# Patient Record
Sex: Female | Born: 1955 | Race: White | Hispanic: No | Smoking: Current every day smoker
Health system: Southern US, Community
[De-identification: ages and names within clinical notes are randomized; demographics above are authoritative.]

## PROBLEM LIST (undated history)

## (undated) DIAGNOSIS — I5033 Acute on chronic diastolic (congestive) heart failure: Secondary | ICD-10-CM

## (undated) DIAGNOSIS — R0789 Other chest pain: Secondary | ICD-10-CM

## (undated) DIAGNOSIS — Z87442 Personal history of urinary calculi: Secondary | ICD-10-CM

## (undated) HISTORY — PX: ABDOMINAL HYSTERECTOMY: SHX81

## (undated) HISTORY — PX: SHOULDER SURGERY: SHX246

## (undated) HISTORY — PX: OTHER SURGICAL HISTORY: SHX169

---

## 2002-02-17 ENCOUNTER — Other Ambulatory Visit: Admission: RE | Admit: 2002-02-17 | Discharge: 2002-02-17 | Payer: Self-pay | Admitting: *Deleted

## 2003-07-20 ENCOUNTER — Other Ambulatory Visit: Admission: RE | Admit: 2003-07-20 | Discharge: 2003-07-20 | Payer: Self-pay | Admitting: *Deleted

## 2003-11-16 ENCOUNTER — Ambulatory Visit (HOSPITAL_BASED_OUTPATIENT_CLINIC_OR_DEPARTMENT_OTHER): Admission: RE | Admit: 2003-11-16 | Discharge: 2003-11-16 | Payer: Self-pay | Admitting: Orthopedic Surgery

## 2004-12-24 ENCOUNTER — Other Ambulatory Visit: Admission: RE | Admit: 2004-12-24 | Discharge: 2004-12-24 | Payer: Self-pay | Admitting: *Deleted

## 2006-02-04 ENCOUNTER — Other Ambulatory Visit: Admission: RE | Admit: 2006-02-04 | Discharge: 2006-02-04 | Payer: Self-pay | Admitting: *Deleted

## 2011-09-17 ENCOUNTER — Emergency Department (HOSPITAL_COMMUNITY): Payer: BC Managed Care – PPO

## 2011-09-17 ENCOUNTER — Encounter: Payer: Self-pay | Admitting: *Deleted

## 2011-09-17 ENCOUNTER — Emergency Department (HOSPITAL_COMMUNITY)
Admission: EM | Admit: 2011-09-17 | Discharge: 2011-09-17 | Disposition: A | Discharge status: Home / Self Care | Payer: BC Managed Care – PPO | Attending: Emergency Medicine | Admitting: Emergency Medicine

## 2011-09-17 DIAGNOSIS — S93602A Unspecified sprain of left foot, initial encounter: Secondary | ICD-10-CM

## 2011-09-17 DIAGNOSIS — M25579 Pain in unspecified ankle and joints of unspecified foot: Secondary | ICD-10-CM | POA: Insufficient documentation

## 2011-09-17 DIAGNOSIS — M79609 Pain in unspecified limb: Secondary | ICD-10-CM | POA: Insufficient documentation

## 2011-09-17 DIAGNOSIS — S93609A Unspecified sprain of unspecified foot, initial encounter: Secondary | ICD-10-CM | POA: Insufficient documentation

## 2011-09-17 DIAGNOSIS — S93402A Sprain of unspecified ligament of left ankle, initial encounter: Secondary | ICD-10-CM

## 2011-09-17 DIAGNOSIS — X500XXA Overexertion from strenuous movement or load, initial encounter: Secondary | ICD-10-CM | POA: Insufficient documentation

## 2011-09-17 DIAGNOSIS — S93409A Sprain of unspecified ligament of unspecified ankle, initial encounter: Secondary | ICD-10-CM | POA: Insufficient documentation

## 2011-09-17 MED ORDER — OXYCODONE-ACETAMINOPHEN 5-325 MG PO TABS
1.0000 | ORAL_TABLET | Freq: Once | ORAL | Status: AC
  Administered 2011-09-17: 1 via ORAL
  Filled 2011-09-17: qty 1

## 2011-09-17 MED ORDER — OXYCODONE-ACETAMINOPHEN 5-325 MG PO TABS: 1.0000 | ORAL_TABLET | ORAL | Status: AC | PRN

## 2011-09-17 NOTE — ED Provider Notes (Signed)
History     CSN: [id number]  Arrival date & time 09/17/11  0106   First MD Initiated Contact with Patient 09/17/11 0116      Chief Complaint  Patient presents with  . Ankle Pain    (Consider location/radiation/quality/duration/timing/severity/associated sxs/prior treatment) Patient is a 55 y.o. female presenting with ankle pain. The history is provided by the patient.  Ankle Pain    55 year old female twisted her left foot and ankle while going down steps. She did not actually fall. She is complaining of pain across the lateral aspect of the left ankle and proximal left midfoot and into the dorsum of the left midfoot. Pain is severe and she rates it at 8/10. Pain is worse with trying to bear weight and with movement and with palpation. Nothing makes it any better. She denies other injury.  No past medical history on file.  No past surgical history on file.  No family history on file.  History  Substance Use Topics  . Smoking status: Not on file  . Smokeless tobacco: Not on file  . Alcohol Use: Not on file    OB History    No data available      Review of Systems  All other systems reviewed and are negative.    Allergies  Review of patient's allergies indicates not on file.  Home Medications  No current outpatient prescriptions on file.  There were no vitals taken for this visit.  Physical Exam  Nursing note and vitals reviewed.  55 year old female is resting comfortably and in no acute distress. Vital signs are normal. Head is normocephalic and atraumatic. PERRLA, EOMI. Oropharynx is clear. Neck is supple without adenopathy. Back is nontender. Lungs are clear with.rales, wheezes, rhonchi. Heart has regular rate and rhythm without murmur. Abdomen is soft, flat, nontender without masses or visceromegaly. Extremities: There is mild to moderate swelling of the dorsolateral aspect of the proximal left midfoot. There is tenderness to palpation of the left ankle in the  region of the left fibulotalar ligament. There is moderate tenderness to palpation over the lateral aspect of the dorsum of the proximal midfoot. There is no tenderness palpation over the fifth metatarsal. Distal neurovascular exam is intact with prompt capillary refill and normal sensation. Skin is warm and moist without rash. Neurologic: The mental status is normal, cranial nerves are intact, there no focal motor or sensory deficits. Psychiatric: No abnormalities of mood or affect.  ED Course  Procedures (including critical care time)  Labs Reviewed - No data to display No results found. No results found for this or any previous visit. Dg Ankle Complete Left  09/17/2011  *RADIOLOGY REPORT*  Clinical Data: Twisted left ankle and fell down steps; lateral ankle pain.  LEFT ANKLE COMPLETE - 3+ VIEW  Comparison: None.  Findings: There is no evidence of fracture or dislocation.  A small osseous fragment adjacent to the medial malleolus may reflect remote injury.  The ankle mortise is intact; the interosseous space is within normal limits.  No talar tilt or subluxation is seen.  The joint spaces are preserved.  No significant soft tissue abnormalities are seen.  IMPRESSION: No evidence of acute fracture or dislocation.  Original Report Authenticated By: Tonia Ghent, M.D.   Dg Foot Complete Left  09/17/2011  *RADIOLOGY REPORT*  Clinical Data: Twisted ankle and fell down steps; dorsal left foot pain.  LEFT FOOT - COMPLETE 3+ VIEW  Comparison: None.  Findings: There is no evidence of fracture or dislocation.  Partial absence of the fifth middle phalanx likely reflects remote injury. The joint spaces are preserved.  There is no evidence of talar subluxation; the subtalar joint is unremarkable in appearance.  No significant soft tissue abnormalities are seen.  IMPRESSION: No evidence of fracture or dislocation.  Original Report Authenticated By: Tonia Ghent, M.D.     No diagnosis found.  X-rays show no  evidence of fracture. She is placed in an ankle splint orthotic and given crutches to use for comfort. She has been given a dose of Percocet in the emergency department and will be given a prescription for Percocet to take at home. She is referred to Dr. Lajoyce Corners, who is on call for orthopedics.  MDM  Foot and ankle injury - possible fracture        Dione Booze, MD 09/17/11 0157

## 2011-09-17 NOTE — ED Notes (Signed)
Patient states she was walking down the steps and thought she was on the last step and twisted her left ankle

## 2011-09-17 NOTE — ED Notes (Signed)
Crutches and ankle splint  Applied  Instructions given to patient

## 2013-04-18 ENCOUNTER — Emergency Department (HOSPITAL_COMMUNITY): Payer: BC Managed Care – PPO | Admitting: Anesthesiology

## 2013-04-18 ENCOUNTER — Emergency Department (HOSPITAL_COMMUNITY)
Admission: EM | Admit: 2013-04-18 | Discharge: 2013-04-19 | Disposition: A | Discharge status: Home / Self Care | Payer: BC Managed Care – PPO | Attending: Emergency Medicine | Admitting: Emergency Medicine

## 2013-04-18 ENCOUNTER — Emergency Department (HOSPITAL_COMMUNITY): Payer: BC Managed Care – PPO

## 2013-04-18 ENCOUNTER — Encounter (HOSPITAL_COMMUNITY): Payer: Self-pay | Admitting: Nurse Practitioner

## 2013-04-18 ENCOUNTER — Encounter (HOSPITAL_COMMUNITY)
Admission: EM | Disposition: A | Discharge status: Home / Self Care | Payer: Self-pay | Source: Home / Self Care | Attending: Emergency Medicine

## 2013-04-18 ENCOUNTER — Encounter (HOSPITAL_COMMUNITY): Payer: Self-pay | Admitting: Anesthesiology

## 2013-04-18 DIAGNOSIS — N1330 Unspecified hydronephrosis: Secondary | ICD-10-CM | POA: Insufficient documentation

## 2013-04-18 DIAGNOSIS — R109 Unspecified abdominal pain: Secondary | ICD-10-CM | POA: Insufficient documentation

## 2013-04-18 DIAGNOSIS — R310 Gross hematuria: Secondary | ICD-10-CM | POA: Insufficient documentation

## 2013-04-18 DIAGNOSIS — N201 Calculus of ureter: Secondary | ICD-10-CM

## 2013-04-18 HISTORY — PX: CYSTOSCOPY W/ RETROGRADES: SHX1426

## 2013-04-18 LAB — URINALYSIS, ROUTINE W REFLEX MICROSCOPIC
Glucose, UA: NEGATIVE mg/dL
Ketones, ur: 15 mg/dL — AB
Nitrite: POSITIVE — AB
Protein, ur: 100 mg/dL — AB
Specific Gravity, Urine: 1.023 (ref 1.005–1.030)
Urobilinogen, UA: 1 mg/dL (ref 0.0–1.0)
pH: 5.5 (ref 5.0–8.0)

## 2013-04-18 LAB — CBC WITH DIFFERENTIAL/PLATELET
Basophils Absolute: 0.1 K/uL (ref 0.0–0.1)
Basophils Relative: 0 % (ref 0–1)
Eosinophils Absolute: 0 K/uL (ref 0.0–0.7)
Eosinophils Relative: 0 % (ref 0–5)
HCT: 42.8 % (ref 36.0–46.0)
Hemoglobin: 14.9 g/dL (ref 12.0–15.0)
Lymphocytes Relative: 6 % — ABNORMAL LOW (ref 12–46)
Lymphs Abs: 1.1 10*3/uL (ref 0.7–4.0)
MCH: 31 pg (ref 26.0–34.0)
MCHC: 34.8 g/dL (ref 30.0–36.0)
MCV: 89.2 fL (ref 78.0–100.0)
Monocytes Absolute: 0.3 10*3/uL (ref 0.1–1.0)
Monocytes Relative: 1 % — ABNORMAL LOW (ref 3–12)
Neutro Abs: 17.9 K/uL — ABNORMAL HIGH (ref 1.7–7.7)
Neutrophils Relative %: 93 % — ABNORMAL HIGH (ref 43–77)
Platelets: 289 K/uL (ref 150–400)
RBC: 4.8 MIL/uL (ref 3.87–5.11)
RDW: 13.2 % (ref 11.5–15.5)
WBC: 19.3 10*3/uL — ABNORMAL HIGH (ref 4.0–10.5)

## 2013-04-18 LAB — COMPREHENSIVE METABOLIC PANEL WITH GFR
ALT: 17 U/L (ref 0–35)
AST: 27 U/L (ref 0–37)
CO2: 23 meq/L (ref 19–32)
Calcium: 9.8 mg/dL (ref 8.4–10.5)
Chloride: 100 meq/L (ref 96–112)
Creatinine, Ser: 0.95 mg/dL (ref 0.50–1.10)
GFR calc Af Amer: 75 mL/min — ABNORMAL LOW (ref >90)
GFR calc non Af Amer: 64 mL/min — ABNORMAL LOW (ref >90)
Glucose, Bld: 110 mg/dL — ABNORMAL HIGH (ref 70–99)
Sodium: 136 meq/L (ref 135–145)
Total Bilirubin: 0.4 mg/dL (ref 0.3–1.2)

## 2013-04-18 LAB — URINE MICROSCOPIC-ADD ON

## 2013-04-18 LAB — COMPREHENSIVE METABOLIC PANEL
Albumin: 4.6 g/dL (ref 3.5–5.2)
Alkaline Phosphatase: 80 U/L (ref 39–117)
BUN: 26 mg/dL — ABNORMAL HIGH (ref 6–23)
Potassium: 4.7 mEq/L (ref 3.5–5.1)
Total Protein: 7.9 g/dL (ref 6.0–8.3)

## 2013-04-18 SURGERY — CYSTOSCOPY, WITH RETROGRADE PYELOGRAM
Anesthesia: General | Site: Perineum | Laterality: Left | Wound class: Clean Contaminated

## 2013-04-18 MED ORDER — PHENYLEPHRINE HCL 10 MG/ML IJ SOLN
INTRAMUSCULAR | Status: DC | PRN
  Administered 2013-04-18: 80 ug via INTRAVENOUS
  Administered 2013-04-19: 120 ug via INTRAVENOUS
  Administered 2013-04-19: 80 ug via INTRAVENOUS

## 2013-04-18 MED ORDER — LIDOCAINE HCL (CARDIAC) 20 MG/ML IV SOLN
INTRAVENOUS | Status: DC | PRN
  Administered 2013-04-18: 50 mg via INTRAVENOUS

## 2013-04-18 MED ORDER — DEXTROSE 5 % IV SOLN
1.0000 g | Freq: Once | INTRAVENOUS | Status: AC
  Administered 2013-04-18: 1 g via INTRAVENOUS
  Filled 2013-04-18: qty 10

## 2013-04-18 MED ORDER — LACTATED RINGERS IV SOLN
INTRAVENOUS | Status: DC | PRN
  Administered 2013-04-18: 23:00:00 via INTRAVENOUS

## 2013-04-18 MED ORDER — MORPHINE SULFATE 4 MG/ML IJ SOLN
4.0000 mg | Freq: Once | INTRAMUSCULAR | Status: AC
  Administered 2013-04-18: 4 mg via INTRAVENOUS
  Filled 2013-04-18: qty 1

## 2013-04-18 MED ORDER — SODIUM CHLORIDE 0.9 % IV BOLUS (SEPSIS)
1000.0000 mL | Freq: Once | INTRAVENOUS | Status: AC
  Administered 2013-04-18: 1000 mL via INTRAVENOUS

## 2013-04-18 MED ORDER — CEFAZOLIN SODIUM 1-5 GM-% IV SOLN
INTRAVENOUS | Status: DC | PRN
  Administered 2013-04-18: 1 g via INTRAVENOUS

## 2013-04-18 MED ORDER — PROPOFOL 10 MG/ML IV BOLUS
INTRAVENOUS | Status: DC | PRN
  Administered 2013-04-18: 90 mg via INTRAVENOUS

## 2013-04-18 SURGICAL SUPPLY — 15 items
ADAPTER CATH URET PLST 4-6FR (CATHETERS) ×1 IMPLANT
ADPR CATH URET STRL DISP 4-6FR (CATHETERS) ×1
BAG URO CATCHER STRL LF (DRAPE) ×1 IMPLANT
CANISTER SUCT LVC 12 LTR MEDI- (MISCELLANEOUS) ×1 IMPLANT
CATH URET 5FR 28IN OPEN ENDED (CATHETERS) ×1 IMPLANT
DRAPE CAMERA CLOSED 9X96 (DRAPES) ×1 IMPLANT
GLOVE BIO SURGEON STRL SZ7.5 (GLOVE) ×1 IMPLANT
GLOVE BIO SURGEON STRL SZ8 (GLOVE) ×1 IMPLANT
GLOVE BIOGEL PI IND STRL 8 (GLOVE) IMPLANT
GLOVE BIOGEL PI INDICATOR 8 (GLOVE) ×1
GUIDEWIRE STR DUAL SENSOR (WIRE) ×1 IMPLANT
KIT ROOM TURNOVER OR (KITS) ×1 IMPLANT
PACK CYSTOSCOPY (CUSTOM PROCEDURE TRAY) ×1 IMPLANT
SOLIDIFIER ZAPPA LOC 440G (MISCELLANEOUS) ×4 IMPLANT
TOWEL OR 17X24 6PK STRL BLUE (TOWEL DISPOSABLE) ×1 IMPLANT

## 2013-04-18 NOTE — ED Notes (Signed)
Pt reports hematuria, lower abd pain, and nausea onset today.

## 2013-04-18 NOTE — H&P (Signed)
Urology History and Physical Exam  CC: Left ureter stone.  HPI: 57 year old female presents to the ER with left flank pain. CT abdomen and pelvis without contrast revealed a ureter stone. It is located in the left ureter. It is proximal in nature. It is 7 mm in size. There is also a smaller left lower pole stone present. There shredding around the kidney. There is also hydronephrosis. This is associated with gross hematuria. UA is positive for leukocyte esterase and nitrites and concerning for infection. She had leukocytosis to 19. She denies fever, chills, nausea, or emesis. She did not have tachycardia and does not appear septic. She never had a stone previously. This all began around 4 PM yesterday. We discussed management options including trial of stone passage versus placement of ureter stent. Given her clinical picture with concern over infection I recommend placing a stent.  Today we discussed the management of obstructing urinary stones. These options include observation with pain control, percutaneous nephrostomy tube, or cystoscopy with retrograde ureteral stent placement with possible ureteroscopy and laser lithotripsy.  We discussed the risks, benefits, alternatives and likelihood of achieving the patient's goals.  We discussed which options are relevant to this particular situation. We discussed the natural history of stones and the as well as the complications of untreated stones and the impact on quality of life without treatment as well as with each of the above listed treatments. We also discussed the efficacy of each treatment. With any of these management options I discussed the signs and symptoms of infection and the need for emergent treatment should these be experienced.   For observation I described the risks which include, but are not limited to, silent renal damage, life-threatening infection, need for emergent surgery, failure to pass stone, and pain.  For stent placement with  or without ureteroscopy and lithotripsy I described the risks which include, but are not limited to, heart attack, stroke, pulmonary embolus, death, bleeding, infection, damage to contiguous structures, positioning injury, ureteral stricture, ureteral avulsion, ureteral injury, need for ureteral stent, inability to perform ureteroscopy, need for an interval procedure, inability to clear stone burden, stent discomfort and pain.  For percutaneous nephrostomy tube I described the risks which including, but are not limited to, heart attack, stroke, pulmonary embolus, death, arterial venous fistula or malformation, need for embolization of kidney, loss of kidney or renal function.  The patient had the opportunity to ask questions to their stated satisfaction and voiced understanding.     PMH: History reviewed. No pertinent past medical history.  PSH: Past Surgical History  Procedure Laterality Date  . Abdominal hysterectomy    . Shoulder surgery    . Right ankle surgery      Allergies: Allergies  Allergen Reactions  . Penicillins Rash    Medications:  (Not in a hospital admission)   Social History: History   Social History  . Marital Status: Divorced    Spouse Name: N/A    Number of Children: N/A  . Years of Education: N/A   Occupational History  . Not on file.   Social History Main Topics  . Smoking status: Current Every Day Smoker -- 1.00 packs/day    Types: Cigarettes  . Smokeless tobacco: Not on file  . Alcohol Use: No  . Drug Use: No  . Sexually Active: Not Currently   Other Topics Concern  . Not on file   Social History Narrative  . No narrative on file    Family History:  History reviewed. No pertinent family history.  Review of Systems: Positive: Left flank pain, gross hematuria, cough. Negative: Chest pain, SOB, or fever.  A further 10 point review of systems was negative except what is listed in the HPI.  Physical Exam: Filed Vitals:   04/18/13  1819  BP: 153/95  Pulse: 91  Temp: 98.4 F (36.9 C)  Resp: 16    General: No acute distress.  Awake. Head:  Normocephalic.  Atraumatic. ENT:  EOMI.  Mucous membranes moist Neck:  Supple.  No lymphadenopathy. CV:  S1 present. S2 present. Regular rate. Pulmonary: Equal effort bilaterally.  Clear to auscultation bilaterally. Abdomen: Soft.  Non- tender to palpation. Skin:  Normal turgor.  No visible rash. Extremity: No gross deformity of bilateral upper extremities.  No gross deformity of    bilateral lower extremities. Neurologic: Alert. Appropriate mood.    Studies:  Recent Labs     04/18/13  1935  HGB  14.9  WBC  19.3*  PLT  289    Recent Labs     04/18/13  1935  NA  136  K  4.7  CL  100  CO2  23  BUN  26*  CREATININE  0.95  CALCIUM  9.8  GFRNONAA  64*  GFRAA  75*     No results found for this basename: PT, INR, APTT,  in the last 72 hours   No components found with this basename: ABG,     Assessment:  Left ureter stone  Plan: To OR for cystoscopy, possible left retrograde pyelogram, and left ureter stent placement.

## 2013-04-18 NOTE — Anesthesia Preprocedure Evaluation (Addendum)
Anesthesia Evaluation  Patient identified by MRN, date of birth, ID band Patient awake and Patient unresponsive    Reviewed: Allergy & Precautions, H&P , NPO status , Patient's Chart, lab work & pertinent test results, reviewed documented beta blocker date and time   Airway Mallampati: I TM Distance: >3 FB Neck ROM: full    Dental  (+) Teeth Intact and Dental Advisory Given   Pulmonary Current Smoker,          Cardiovascular Rhythm:regular Rate:Normal     Neuro/Psych    GI/Hepatic   Endo/Other    Renal/GU Renal disease     Musculoskeletal   Abdominal   Peds  Hematology   Anesthesia Other Findings Pt sedated from pain meds  Reproductive/Obstetrics                        Anesthesia Physical Anesthesia Plan  ASA: II and emergent  Anesthesia Plan: General   Post-op Pain Management:    Induction: Intravenous  Airway Management Planned: Oral ETT and LMA  Additional Equipment:   Intra-op Plan:   Post-operative Plan: Extubation in OR  Informed Consent: I have reviewed the patients History and Physical, chart, labs and discussed the procedure including the risks, benefits and alternatives for the proposed anesthesia with the patient or authorized representative who has indicated his/her understanding and acceptance.     Plan Discussed with: CRNA, Anesthesiologist and Surgeon  Anesthesia Plan Comments:        Anesthesia Quick Evaluation

## 2013-04-18 NOTE — ED Provider Notes (Signed)
CSN: [id number]     Arrival date & time 04/18/13  1804 History     First MD Initiated Contact with Patient 04/18/13 1928     Chief Complaint  Patient presents with  . Abdominal Pain   (Consider location/radiation/quality/duration/timing/severity/associated sxs/prior Treatment) Patient is a 57 y.o. female presenting with flank pain. The history is provided by the patient.  Flank Pain This is a new (left sided, radaites to left groin) problem. The current episode started today (16:00). The problem occurs constantly. The problem has been gradually worsening. Associated symptoms include urinary symptoms (hematuria; started this afternoon, worsening to frank hematuria.). Pertinent negatives include no abdominal pain, arthralgias, change in bowel habit, chest pain, chills, coughing, diaphoresis, fever, headaches, joint swelling, myalgias, nausea, neck pain, numbness, sore throat, swollen glands, vertigo, vomiting or weakness. The symptoms are aggravated by standing. She has tried nothing for the symptoms.    History reviewed. No pertinent past medical history. Past Surgical History  Procedure Laterality Date  . Abdominal hysterectomy    . Shoulder surgery    . Right ankle surgery     History reviewed. No pertinent family history. History  Substance Use Topics  . Smoking status: Current Every Day Smoker -- 1.00 packs/day    Types: Cigarettes  . Smokeless tobacco: Not on file  . Alcohol Use: No   OB History   Grav Para Term Preterm Abortions TAB SAB Ect Mult Living                 Review of Systems  Constitutional: Negative for fever, chills, diaphoresis, activity change and appetite change.  HENT: Negative for sore throat, rhinorrhea, sneezing, drooling, trouble swallowing and neck pain.   Eyes: Negative for discharge and redness.  Respiratory: Negative for cough, chest tightness, shortness of breath, wheezing and stridor.   Cardiovascular: Negative for chest pain and leg swelling.   Gastrointestinal: Negative for nausea, vomiting, abdominal pain, diarrhea, constipation, blood in stool and change in bowel habit.  Genitourinary: Positive for hematuria and flank pain. Negative for urgency, decreased urine volume, vaginal bleeding, vaginal discharge, difficulty urinating, vaginal pain and pelvic pain.  Musculoskeletal: Negative for myalgias, joint swelling and arthralgias.  Skin: Negative for pallor.  Neurological: Negative for dizziness, vertigo, syncope, speech difficulty, weakness, light-headedness, numbness and headaches.  Hematological: Negative for adenopathy. Does not bruise/bleed easily.  Psychiatric/Behavioral: Negative for confusion and agitation.    Allergies  Penicillins  Home Medications   Current Outpatient Rx  Name  Route  Sig  Dispense  Refill  . ciprofloxacin (CIPRO) 500 MG tablet   Oral   Take 1 tablet (500 mg total) by mouth 2 (two) times daily.   14 tablet   0   . hyoscyamine (LEVSIN, ANASPAZ) 0.125 MG tablet   Oral   Take 1 tablet (0.125 mg total) by mouth every 4 (four) hours as needed for cramping (bladder spasms).   40 tablet   4   . oxybutynin (DITROPAN) 5 MG tablet   Oral   Take 1 tablet (5 mg total) by mouth every 6 (six) hours as needed (bladder spasms).   40 tablet   4   . oxyCODONE-acetaminophen (PERCOCET) 5-325 MG per tablet   Oral   Take 1-2 tablets by mouth every 4 (four) hours as needed for pain.   60 tablet   0   . phenazopyridine (PYRIDIUM) 100 MG tablet   Oral   Take 1 tablet (100 mg total) by mouth every 8 (eight) hours as needed  for pain (Burning urination.  Will turn urine and body fluids orange.).   30 tablet   1   . senna-docusate (SENOKOT S) 8.6-50 MG per tablet   Oral   Take 1 tablet by mouth 2 (two) times daily.   60 tablet   0    BP 153/95  Pulse 91  Temp(Src) 99.3 F (37.4 C) (Oral)  Resp 16  Ht 5\' 2"  (1.575 m)  Wt 161 lb (73.029 kg)  BMI 29.44 kg/m2  SpO2 100% Physical Exam   Constitutional: She is oriented to person, place, and time. She appears well-developed and well-nourished. No distress.  HENT:  Head: Normocephalic and atraumatic.  Right Ear: External ear normal.  Left Ear: External ear normal.  Eyes: Conjunctivae and EOM are normal. Right eye exhibits no discharge. Left eye exhibits no discharge.  Neck: Normal range of motion. Neck supple. No JVD present.  Cardiovascular: Normal rate, regular rhythm and normal heart sounds.  Exam reveals no gallop and no friction rub.   No murmur heard. Pulmonary/Chest: Effort normal and breath sounds normal. No stridor. No respiratory distress. She has no wheezes. She has no rales. She exhibits no tenderness.  Abdominal: Soft. Bowel sounds are normal. She exhibits no distension. There is tenderness in the suprapubic area. There is CVA tenderness (left). There is no rigidity, no rebound, no guarding, no tenderness at McBurney's point and negative Murphy's sign.  Musculoskeletal: Normal range of motion. She exhibits no edema.  Neurological: She is alert and oriented to person, place, and time.  Skin: Skin is warm. No rash noted. She is not diaphoretic.  Psychiatric: She has a normal mood and affect. Her behavior is normal.    ED Course   Procedures (including critical care time)  Labs Reviewed  COMPREHENSIVE METABOLIC PANEL - Abnormal; Notable for the following:    Glucose, Bld 110 (*)    BUN 26 (*)    GFR calc non Af Amer 64 (*)    GFR calc Af Amer 75 (*)    All other components within normal limits  CBC WITH DIFFERENTIAL - Abnormal; Notable for the following:    WBC 19.3 (*)    Neutrophils Relative % 93 (*)    Neutro Abs 17.9 (*)    Lymphocytes Relative 6 (*)    Monocytes Relative 1 (*)    All other components within normal limits  URINALYSIS, ROUTINE W REFLEX MICROSCOPIC - Abnormal; Notable for the following:    Color, Urine RED (*)    APPearance TURBID (*)    Hgb urine dipstick LARGE (*)    Bilirubin  Urine SMALL (*)    Ketones, ur 15 (*)    Protein, ur 100 (*)    Nitrite POSITIVE (*)    Leukocytes, UA MODERATE (*)    All other components within normal limits  URINE MICROSCOPIC-ADD ON - Abnormal; Notable for the following:    Bacteria, UA MANY (*)    All other components within normal limits  URINE CULTURE  CBC WITH DIFFERENTIAL  COMPREHENSIVE METABOLIC PANEL   Results for orders placed during the hospital encounter of 04/18/13  COMPREHENSIVE METABOLIC PANEL      Result Value Range   Sodium 136  135 - 145 mEq/L   Potassium 4.7  3.5 - 5.1 mEq/L   Chloride 100  96 - 112 mEq/L   CO2 23  19 - 32 mEq/L   Glucose, Bld 110 (*) 70 - 99 mg/dL   BUN 26 (*) 6 -  23 mg/dL   Creatinine, Ser 8.29  0.50 - 1.10 mg/dL   Calcium 9.8  8.4 - 56.2 mg/dL   Total Protein 7.9  6.0 - 8.3 g/dL   Albumin 4.6  3.5 - 5.2 g/dL   AST 27  0 - 37 U/L   ALT 17  0 - 35 U/L   Alkaline Phosphatase 80  39 - 117 U/L   Total Bilirubin 0.4  0.3 - 1.2 mg/dL   GFR calc non Af Amer 64 (*) >90 mL/min   GFR calc Af Amer 75 (*) >90 mL/min  CBC WITH DIFFERENTIAL      Result Value Range   WBC 19.3 (*) 4.0 - 10.5 K/uL   RBC 4.80  3.87 - 5.11 MIL/uL   Hemoglobin 14.9  12.0 - 15.0 g/dL   HCT 13.0  86.5 - 78.4 %   MCV 89.2  78.0 - 100.0 fL   MCH 31.0  26.0 - 34.0 pg   MCHC 34.8  30.0 - 36.0 g/dL   RDW 69.6  29.5 - 28.4 %   Platelets 289  150 - 400 K/uL   Neutrophils Relative % 93 (*) 43 - 77 %   Neutro Abs 17.9 (*) 1.7 - 7.7 K/uL   Lymphocytes Relative 6 (*) 12 - 46 %   Lymphs Abs 1.1  0.7 - 4.0 K/uL   Monocytes Relative 1 (*) 3 - 12 %   Monocytes Absolute 0.3  0.1 - 1.0 K/uL   Eosinophils Relative 0  0 - 5 %   Eosinophils Absolute 0.0  0.0 - 0.7 K/uL   Basophils Relative 0  0 - 1 %   Basophils Absolute 0.1  0.0 - 0.1 K/uL  URINALYSIS, ROUTINE W REFLEX MICROSCOPIC      Result Value Range   Color, Urine RED (*) YELLOW   APPearance TURBID (*) CLEAR   Specific Gravity, Urine 1.023  1.005 - 1.030   pH 5.5  5.0 -  8.0   Glucose, UA NEGATIVE  NEGATIVE mg/dL   Hgb urine dipstick LARGE (*) NEGATIVE   Bilirubin Urine SMALL (*) NEGATIVE   Ketones, ur 15 (*) NEGATIVE mg/dL   Protein, ur 132 (*) NEGATIVE mg/dL   Urobilinogen, UA 1.0  0.0 - 1.0 mg/dL   Nitrite POSITIVE (*) NEGATIVE   Leukocytes, UA MODERATE (*) NEGATIVE  URINE MICROSCOPIC-ADD ON      Result Value Range   Squamous Epithelial / LPF RARE  RARE   WBC, UA 7-10  <3 WBC/hpf   RBC / HPF TOO NUMEROUS TO COUNT  <3 RBC/hpf   Bacteria, UA MANY (*) RARE    Ct Abdomen Pelvis Wo Contrast  04/18/2013   *RADIOLOGY REPORT*  Clinical Data: Hematuria and lower abdominal pain.  CT ABDOMEN AND PELVIS WITHOUT CONTRAST  Technique:  Multidetector CT imaging of the abdomen and pelvis was performed following the standard protocol without intravenous contrast.  Comparison: None.  Findings: There is evidence of moderately severe hydronephrosis of the left kidney with a 7 mm calculus located in the proximal ureter.  Small nonobstructing calculi are present in the lower pole of the left kidney.  No calculi are identified on the right side.  Unenhanced appearance of the liver, gallbladder, pancreas, spleen, adrenal glands and bowel are unremarkable.  The bladder is decompressed and normal in appearance.  There is a small right inguinal hernia containing fat.  No abnormal fluid collections are seen.  No evidence of focal mass or enlarged lymph nodes.  Bony  structures show degenerative changes of the lumbar spine.  IMPRESSION: Moderately severe left hydronephrosis secondary to a 7 mm calculus located in the proximal ureter.   Original Report Authenticated By: Irish Lack, M.D.   1. Ureteral stone     MDM  Enid Skeens 57 y.o. presents with flank pain and frank hematuria. AFVSS. Hemodynamically stable. CVA tenderness noted. UA concerning for UTI with large amount of blood. CT stone study significant for 7 mm stone in the left proximal ureter with severe hydronephrosis. Patient  was given 1 g Rocephin emergency department, an IV fluids. Urology consult.  Send urine for culture. Urology would rec a stent. Patient taken the operating room to have a ureteral stent placed. Care is assumed by the urologist.  Labs and imaging reviewed. I discussed patient's care at my attending, Dr. Oletta Lamas.  Sena Hitch, MD 04/19/13 [phone]

## 2013-04-18 NOTE — Preoperative (Signed)
Beta Blockers   Reason not to administer Beta Blockers:Not Applicable. No home beta blockers 

## 2013-04-18 NOTE — ED Provider Notes (Addendum)
Medical screening examination/treatment/procedure(s) were conducted as a shared visit with non-physician practitioner(s) and myself.  I personally evaluated the patient during the encounter  Pt with flank pain, gross hematuria.  CT shows proximal ureteral stone.  Pain controlled in the ED.  Will need strainer and referral to urologist for follow up.  Abd soft, no guard or rebound.    Gavin Pound. Natoria Archibald, MD 04/18/13 2117   After Dr. Malen Gauze discussed with urologist, given no prior h/o stone, possible infection and 7 mm size, recommended ureteral stent placement.  Pt opted to go ahead and proceed today.  Dr. Margarita Grizzle to see pt in the ED and admit for stent placement.    Gavin Pound. Oletta Lamas, MD 04/19/13 [phone]

## 2013-04-19 ENCOUNTER — Emergency Department (HOSPITAL_COMMUNITY): Payer: BC Managed Care – PPO

## 2013-04-19 MED ORDER — OXYCODONE-ACETAMINOPHEN 5-325 MG PO TABS: 1.0000 | ORAL_TABLET | ORAL | Status: DC | PRN

## 2013-04-19 MED ORDER — CIPROFLOXACIN HCL 500 MG PO TABS: 500.0000 mg | ORAL_TABLET | Freq: Two times a day (BID) | ORAL | Status: DC

## 2013-04-19 MED ORDER — HYOSCYAMINE SULFATE 0.125 MG PO TABS: 0.1250 mg | ORAL_TABLET | ORAL | Status: DC | PRN

## 2013-04-19 MED ORDER — DIATRIZOATE MEGLUMINE 30 % UR SOLN
URETHRAL | Status: DC | PRN
  Administered 2013-04-19: 300 mL

## 2013-04-19 MED ORDER — SODIUM CHLORIDE 0.9 % IR SOLN
Status: DC | PRN
  Administered 2013-04-18: 3000 mL

## 2013-04-19 MED ORDER — 0.9 % SODIUM CHLORIDE (POUR BTL) OPTIME
TOPICAL | Status: DC | PRN
  Administered 2013-04-19: 22 mL

## 2013-04-19 MED ORDER — OXYBUTYNIN CHLORIDE 5 MG PO TABS: 5.0000 mg | ORAL_TABLET | Freq: Four times a day (QID) | ORAL | Status: DC | PRN

## 2013-04-19 MED ORDER — ONDANSETRON HCL 4 MG/2ML IJ SOLN
INTRAMUSCULAR | Status: DC | PRN
  Administered 2013-04-19: 4 mg via INTRAVENOUS

## 2013-04-19 MED ORDER — PHENAZOPYRIDINE HCL 100 MG PO TABS: 100.0000 mg | ORAL_TABLET | Freq: Three times a day (TID) | ORAL | Status: DC | PRN

## 2013-04-19 MED ORDER — SENNOSIDES-DOCUSATE SODIUM 8.6-50 MG PO TABS: 1.0000 | ORAL_TABLET | Freq: Two times a day (BID) | ORAL | Status: DC

## 2013-04-19 NOTE — Op Note (Signed)
Urology Operative Report  Date of Procedure: 04/18/13  Surgeon: Natalia Leatherwood, MD Assistant:  None  Preoperative Diagnosis: Left ureter stone Postoperative Diagnosis:  Same  Procedure(s): Cystoscopy Left ureter stent placement (6 x 24) Left retrograde pyelogram  Estimated blood loss: None  Specimen: None  Drains: None  Complications: None  Findings: Stone not easily visible on fluoroscopy.  History of present illness: 56 year old female presents today for flank pain. She's found to have a proximal left ureter stone. Urine appeared infected and she had leukocytosis. She is now having fevers or having tachycardia. Because of concern over infection I recommend ureter stent placement. Patient agrees with this.   Procedure in detail: After informed consent was obtained, the patient was taken to the operating room. They were placed in the supine position. SCDs were turned on and in place. IV antibiotics were infused, and general anesthesia was induced. A timeout was performed in which the correct patient, surgical site, and procedure were identified and agreed upon by the team.  The patient was placed in a dorsolithotomy position, making sure to pad all pertinent neurovascular pressure points. The genitals were prepped and draped in the usual sterile fashion.  A rigid cystoscope was advanced through the urethra and into the bladder. The bladder was visualized in a systematic fashion and this was negative for bladder tumors. Attention was then turned the left ureter orifice. A 5 French ureter catheter was inserted and left ureter orifice and fluoroscopy was performed along the expected course of the ureter. The stone was not easily visualized. Therefore gently injected contrast to obtain a retrograde pyelogram to ensure the location of renal pelvis so that I would be able to place the stent into the pelvis past the obstructing stone. Contrast would not go beyond the distal ureter easily  with gentle pressure and therefore I advanced a sensor tip wire up into the left renal pelvis. I then loaded a 5 [place] ureter catheter over the sensor wire until was in the renal pelvis and removed the wire. I again gently injected some contrast to ensure that I was in the pelvis and this was confirmed on fluoroscopy. I then loaded the sensor wire through the 5 French catheter and removed the catheter. I then advanced a 6 x 24 double-J ureter stent over the wire under fluoroscopy into the left renal pelvis without the tethering string attached. This was deployed with a good curl in the bladder. The bladder was drained and this completed the procedure.  She was placed back in a supine position, anesthesia was reversed, and she was taken to the PACU in stable condition.  All counts were correct at the end of the case.  She will be prescribed ciprofloxacin to cover for any possible infection and follow up with me for management.

## 2013-04-19 NOTE — Transfer of Care (Signed)
Immediate Anesthesia Transfer of Care Note  Patient: Kim Wagner  Procedure(s) Performed: Procedure(s): CYSTOSCOPY WITH LEFT RETROGRADE PYELOGRAM; URETER STENT PLACEMENT (Left)  Patient Location: PACU  Anesthesia Type:General  Level of Consciousness: awake and confused  Airway & Oxygen Therapy: Patient Spontanous Breathing and Patient connected to nasal cannula oxygen  Post-op Assessment: Report given to PACU RN and Post -op Vital signs reviewed and stable  Post vital signs: Reviewed and stable  Complications: No apparent anesthesia complications

## 2013-04-19 NOTE — Anesthesia Postprocedure Evaluation (Signed)
  Anesthesia Post-op Note  Patient: Kim Wagner  Procedure(s) Performed: Procedure(s): CYSTOSCOPY WITH LEFT RETROGRADE PYELOGRAM; URETER STENT PLACEMENT (Left)  Patient Location: PACU  Anesthesia Type:General  Level of Consciousness: sedated and patient cooperative  Airway and Oxygen Therapy: Patient Spontanous Breathing  Post-op Pain: none  Post-op Assessment: Post-op Vital signs reviewed, Patient's Cardiovascular Status Stable, Respiratory Function Stable, Patent Airway, No signs of Nausea or vomiting and Pain level controlled  Post-op Vital Signs: stable  Complications: No apparent anesthesia complications

## 2013-04-20 ENCOUNTER — Encounter (HOSPITAL_COMMUNITY): Payer: Self-pay | Admitting: Urology

## 2013-04-20 ENCOUNTER — Other Ambulatory Visit (HOSPITAL_COMMUNITY): Payer: Self-pay | Admitting: Urology

## 2013-04-20 ENCOUNTER — Other Ambulatory Visit: Payer: Self-pay | Admitting: Urology

## 2013-04-20 DIAGNOSIS — M549 Dorsalgia, unspecified: Secondary | ICD-10-CM

## 2013-04-22 LAB — URINE CULTURE: Colony Count: >=100000

## 2013-05-25 ENCOUNTER — Encounter (HOSPITAL_COMMUNITY): Payer: Self-pay | Admitting: Pharmacy Technician

## 2013-05-27 ENCOUNTER — Encounter: Payer: Self-pay | Admitting: *Deleted

## 2013-05-28 ENCOUNTER — Ambulatory Visit (INDEPENDENT_AMBULATORY_CARE_PROVIDER_SITE_OTHER): Payer: BC Managed Care – PPO | Admitting: Cardiology

## 2013-05-28 ENCOUNTER — Encounter: Payer: Self-pay | Admitting: Cardiology

## 2013-05-28 VITALS — BP 101/64 | Ht 62.0 in | Wt 145.4 lb

## 2013-05-28 DIAGNOSIS — R079 Chest pain, unspecified: Secondary | ICD-10-CM

## 2013-05-28 DIAGNOSIS — Z01818 Encounter for other preprocedural examination: Secondary | ICD-10-CM

## 2013-05-28 DIAGNOSIS — Z13220 Encounter for screening for lipoid disorders: Secondary | ICD-10-CM

## 2013-05-28 DIAGNOSIS — Z87891 Personal history of nicotine dependence: Secondary | ICD-10-CM

## 2013-05-28 DIAGNOSIS — R0600 Dyspnea, unspecified: Secondary | ICD-10-CM

## 2013-05-28 DIAGNOSIS — Z01810 Encounter for preprocedural cardiovascular examination: Secondary | ICD-10-CM

## 2013-05-28 DIAGNOSIS — R0609 Other forms of dyspnea: Secondary | ICD-10-CM

## 2013-05-28 NOTE — Progress Notes (Signed)
Exercise Treadmill Test  Pre-Exercise Testing Evaluation   Test  Exercise Tolerance Test Ordering MD: Angelina Sheriff, MD  Interpreting MD: Angelina Sheriff, MD  Unique Test No: 1  Treadmill:  1  Indication for ETT: chest pain - rule out ischemia  Contraindication to ETT: No   Stress Modality: exercise - treadmill  Cardiac Imaging Performed: non   Protocol: standard Bruce - maximal  Max BP:  127/48  Max MPHR (bpm):  161 85% MPR (bpm):  137  MPHR obtained (bpm):  113 % MPHR obtained:  73  Reached 85% MPHR (min:sec):  NA Total Exercise Time (min-sec):  5:35  Workload in METS:  7 Borg Scale: 15  Reason ETT Terminated:  fatigue    ST Segment Analysis At Rest: normal ST segments - no evidence of significant ST depression With Exercise: no evidence of significant ST depression  Other Information Arrhythmia:  No Angina during ETT:  absent (0) Quality of ETT:  non-diagnostic  ETT Interpretation:  normal - no evidence of ischemia by ST analysis  Comments: The patient has no Ischemic ST-T wave changes. However, despite significant dyspnea and working at what she felt to be a high level her blood pressure did not go up. She had a submaximal heart rate. This was a nondiagnostic study.  Recommendations: Given this and her risk factors as well as the dyspnea which could be an anginal equivalent she will need a [employer].

## 2013-05-28 NOTE — Patient Instructions (Addendum)
The current medical regimen is effective;  continue present plan and medications.  Your physician has requested that you have an exercise tolerance test. For further information please visit [email or web address] Please also follow instruction sheet, as given.  Please have fasting blood work (lipid profile) same day.   Smoking Cessation Quitting smoking is important to your health and has many advantages. However, it is not always easy to quit since nicotine is a very addictive drug. Often times, people try 3 times or more before being able to quit. This document explains the best ways for you to prepare to quit smoking. Quitting takes hard work and a lot of effort, but you can do it. ADVANTAGES OF QUITTING SMOKING  You will live longer, feel better, and live better.  Your body will feel the impact of quitting smoking almost immediately.  Within 20 minutes, blood pressure decreases. Your pulse returns to its normal level.  After 8 hours, carbon monoxide levels in the blood return to normal. Your oxygen level increases.  After 24 hours, the chance of having a heart attack starts to decrease. Your breath, hair, and body stop smelling like smoke.  After 48 hours, damaged nerve endings begin to recover. Your sense of taste and smell improve.  After 72 hours, the body is virtually free of nicotine. Your bronchial tubes relax and breathing becomes easier.  After 2 to 12 weeks, lungs can hold more air. Exercise becomes easier and circulation improves.  The risk of having a heart attack, stroke, cancer, or lung disease is greatly reduced.  After 1 year, the risk of coronary heart disease is cut in half.  After 5 years, the risk of stroke falls to the same as a nonsmoker.  After 10 years, the risk of lung cancer is cut in half and the risk of other cancers decreases significantly.  After 15 years, the risk of coronary heart disease drops, usually to the level of a nonsmoker.  If you are  pregnant, quitting smoking will improve your chances of having a healthy baby.  The people you live with, especially any children, will be healthier.  You will have extra money to spend on things other than cigarettes. QUESTIONS TO THINK ABOUT BEFORE ATTEMPTING TO QUIT You may want to talk about your answers with your caregiver.  Why do you want to quit?  If you tried to quit in the past, what helped and what did not?  What will be the most difficult situations for you after you quit? How will you plan to handle them?  Who can help you through the tough times? Your family? Friends? A caregiver?  What pleasures do you get from smoking? What ways can you still get pleasure if you quit? Here are some questions to ask your caregiver:  How can you help me to be successful at quitting?  What medicine do you think would be best for me and how should I take it?  What should I do if I need more help?  What is smoking withdrawal like? How can I get information on withdrawal? GET READY  Set a quit date.  Change your environment by getting rid of all cigarettes, ashtrays, matches, and lighters in your home, car, or work. Do not let people smoke in your home.  Review your past attempts to quit. Think about what worked and what did not. GET SUPPORT AND ENCOURAGEMENT You have a better chance of being successful if you have help. You can get support in many  ways.  Tell your family, friends, and co-workers that you are going to quit and need their support. Ask them not to smoke around you.  Get individual, group, or telephone counseling and support. Programs are available at [employer] and health centers. Call your local health department for information about programs in your area.  Spiritual beliefs and practices may help some smokers quit.  Download a "quit meter" on your computer to keep track of quit statistics, such as how long you have gone without smoking, cigarettes not smoked,  and money saved.  Get a self-help book about quitting smoking and staying off of tobacco. LEARN NEW SKILLS AND BEHAVIORS  Distract yourself from urges to smoke. Talk to someone, go for a walk, or occupy your time with a task.  Change your normal routine. Take a different route to work. Drink tea instead of coffee. Eat breakfast in a different place.  Reduce your stress. Take a hot bath, exercise, or read a book.  Plan something enjoyable to do every day. Reward yourself for not smoking.  Explore interactive web-based programs that specialize in helping you quit. GET MEDICINE AND USE IT CORRECTLY Medicines can help you stop smoking and decrease the urge to smoke. Combining medicine with the above behavioral methods and support can greatly increase your chances of successfully quitting smoking.  Nicotine replacement therapy helps deliver nicotine to your body without the negative effects and risks of smoking. Nicotine replacement therapy includes nicotine gum, lozenges, inhalers, nasal sprays, and skin patches. Some may be available over-the-counter and others require a prescription.  Antidepressant medicine helps people abstain from smoking, but how this works is unknown. This medicine is available by prescription.  Nicotinic receptor partial agonist medicine simulates the effect of nicotine in your brain. This medicine is available by prescription. Ask your caregiver for advice about which medicines to use and how to use them based on your health history. Your caregiver will tell you what side effects to look out for if you choose to be on a medicine or therapy. Carefully read the information on the package. Do not use any other product containing nicotine while using a nicotine replacement product.  RELAPSE OR DIFFICULT SITUATIONS Most relapses occur within the first 3 months after quitting. Do not be discouraged if you start smoking again. Remember, most people try several times before  finally quitting. You may have symptoms of withdrawal because your body is used to nicotine. You may crave cigarettes, be irritable, feel very hungry, cough often, get headaches, or have difficulty concentrating. The withdrawal symptoms are only temporary. They are strongest when you first quit, but they will go away within 10 14 days. To reduce the chances of relapse, try to:  Avoid drinking alcohol. Drinking lowers your chances of successfully quitting.  Reduce the amount of caffeine you consume. Once you quit smoking, the amount of caffeine in your body increases and can give you symptoms, such as a rapid heartbeat, sweating, and anxiety.  Avoid smokers because they can make you want to smoke.  Do not let weight gain distract you. Many smokers will gain weight when they quit, usually less than 10 pounds. Eat a healthy diet and stay active. You can always lose the weight gained after you quit.  Find ways to improve your mood other than smoking. FOR MORE INFORMATION  www.smokefree.gov  Document Released: 09/03/2001 Document Revised: 03/10/2012 Document Reviewed: 12/19/2011 [facility] Patient Information 2014 [place], [place].

## 2013-05-28 NOTE — Progress Notes (Signed)
HPI The patient presents preoperatively prior to getting a kidney stone removed. She has a left ureter stone and is to undergo ureteroscopy in the OR. She does have a left ureter stent. She has not had coronary disease herself has a very extensive family history and long-standing smoking. She says that she does occasionally get some chest discomfort but only if she's running to play with her grandchildren for instance. She does not think this is an accelerated pattern. She does not report any resting chest discomfort, neck or arm discomfort. She has some rare palpitations but she denies any presyncope or syncope. She has no PND or orthopnea. She has had no weight gain or edema. She does have a long-standing smoking history.  Allergies  Allergen Reactions  . Penicillins Rash    Current Outpatient Prescriptions  Medication Sig Dispense Refill  . aspirin 81 MG chewable tablet Chew 81 mg by mouth daily.         No current facility-administered medications for this visit.    No past medical history on file.  Past Surgical History  Procedure Laterality Date  . Abdominal hysterectomy    . Shoulder surgery    . Right ankle surgery    . Cystoscopy w/ retrogrades Left 04/18/2013    Procedure: CYSTOSCOPY WITH LEFT RETROGRADE PYELOGRAM; URETER STENT PLACEMENT;  Surgeon: Milford Cage, MD;  Location: [facility] OR;  Service: Urology;  Laterality: Left;    No family history on file.  History   Social History  . Marital Status: Divorced    Spouse Name: N/A    Number of Children: N/A  . Years of Education: N/A   Occupational History  . Not on file.   Social History Main Topics  . Smoking status: Current Every Day Smoker -- 1.00 packs/day    Types: Cigarettes  . Smokeless tobacco: Not on file  . Alcohol Use: No  . Drug Use: No  . Sexual Activity: Not Currently   Other Topics Concern  . Not on file   Social History Narrative  . No narrative on file    ROS: As stated in the HPI  and negative for all other systems.  PHYSICAL EXAM BP 101/64  Ht 5\' 2"  (1.575 m)  Wt 145 lb 6.4 oz (65.953 kg)  BMI 26.59 kg/m2 GENERAL:  Well appearing HEENT:  Pupils equal round and reactive, fundi not visualized, oral mucosa unremarkable NECK:  No jugular venous distention, waveform within normal limits, carotid upstroke brisk and symmetric, no bruits, no thyromegaly LYMPHATICS:  No cervical, inguinal adenopathy LUNGS:  Clear to auscultation bilaterally BACK:  No CVA tenderness CHEST:  Unremarkable HEART:  PMI not displaced or sustained,S1 and S2 within normal limits, no S3, no S4, no clicks, no rubs, Very brief right upper sternal systolic murmur, no diastolic murmurs ABD:  Flat, positive bowel sounds normal in frequency in pitch, no bruits, no rebound, no guarding, no midline pulsatile mass, no hepatomegaly, no splenomegaly EXT:  2 plus pulses throughout, no edema, no cyanosis no clubbing SKIN:  No rashes no nodules NEURO:  Cranial nerves II through XII grossly intact, motor grossly intact throughout PSYCH:  Cognitively intact, oriented to person place and time   EKG: Sinus rhythm, rate 64, axis within normal limits, intervals within normal limits, no acute ST-T wave changes.  05/28/2013   ASSESSMENT AND PLAN  PREOP:  Because of her significant risk factors she does need screening with an exercise treadmill test. I will bring her back to  this and I will do it prior to her procedure. We discussed aggressive risk reduction.  RISK REDUCTION:   I will check a fasting lipid profile and treat accordingly.  TOBACCO ABUSE:  We discussed the need to stop smoking and she is committed to this and wants to quit "cold [place]".

## 2013-05-28 NOTE — Patient Instructions (Signed)
Your physician has requested that you have a lexiscan myoview. For further information please visit [email or web address]. Please follow instruction sheet, as given.   

## 2013-05-31 ENCOUNTER — Encounter (HOSPITAL_COMMUNITY): Payer: Self-pay

## 2013-05-31 ENCOUNTER — Encounter (HOSPITAL_COMMUNITY)
Admission: RE | Admit: 2013-05-31 | Discharge: 2013-05-31 | Disposition: A | Discharge status: Home / Self Care | Payer: BC Managed Care – PPO | Source: Ambulatory Visit | Attending: Urology | Admitting: Urology

## 2013-05-31 ENCOUNTER — Other Ambulatory Visit: Payer: BC Managed Care – PPO

## 2013-05-31 ENCOUNTER — Ambulatory Visit (HOSPITAL_COMMUNITY)
Admission: RE | Admit: 2013-05-31 | Discharge: 2013-05-31 | Disposition: A | Discharge status: Home / Self Care | Payer: BC Managed Care – PPO | Source: Ambulatory Visit | Attending: Urology | Admitting: Urology

## 2013-05-31 DIAGNOSIS — N201 Calculus of ureter: Secondary | ICD-10-CM | POA: Insufficient documentation

## 2013-05-31 DIAGNOSIS — R0600 Dyspnea, unspecified: Secondary | ICD-10-CM | POA: Insufficient documentation

## 2013-05-31 DIAGNOSIS — Z01818 Encounter for other preprocedural examination: Secondary | ICD-10-CM | POA: Insufficient documentation

## 2013-05-31 DIAGNOSIS — Z01812 Encounter for preprocedural laboratory examination: Secondary | ICD-10-CM | POA: Insufficient documentation

## 2013-05-31 HISTORY — DX: Other chest pain: R07.89

## 2013-05-31 HISTORY — DX: Personal history of urinary calculi: Z87.442

## 2013-05-31 LAB — BASIC METABOLIC PANEL
Chloride: 101 mEq/L (ref 96–112)
GFR calc Af Amer: >90 mL/min (ref >90)
GFR calc non Af Amer: >90 mL/min (ref >90)
Potassium: 4.3 mEq/L (ref 3.5–5.1)
Sodium: 135 mEq/L (ref 135–145)

## 2013-05-31 LAB — CBC
Hemoglobin: 11.1 g/dL — ABNORMAL LOW (ref 12.0–15.0)
MCHC: 33.9 g/dL (ref 30.0–36.0)
RDW: 13 % (ref 11.5–15.5)
WBC: 10.5 10*3/uL (ref 4.0–10.5)

## 2013-05-31 NOTE — Patient Instructions (Addendum)
YOUR SURGERY IS SCHEDULED AT [facility]  ON:   Monday 9/15  REPORT TO [place] SHORT STAY [facility] AT:  11:45 AM      PHONE # FOR SHORT STAY IS [phone]  DO NOT EAT  ANYTHING AFTER MIDNIGHT THE NIGHT BEFORE YOUR SURGERY.  NO FOOD, NO CHEWING GUM, NO MINTS, NO CANDIES, NO CHEWING TOBACCO. YOU MAY HAVE CLEAR LIQUIDS TO DRINK FROM MIDNIGHT UNTIL 7:45 AM DAY OF SURGERY - LIKE WATER, ICE TEA.   NOTHING TO DRINK AFTER 7:45 AM DAY OF SURGERY.  PLEASE TAKE THE FOLLOWING MEDICATIONS THE AM OF YOUR SURGERY WITH A FEW SIPS OF WATER:  NO MEDICINES TO TAKE.    DO NOT BRING VALUABLES, MONEY, CREDIT CARDS.  DO NOT WEAR JEWELRY, MAKE-UP, NAIL POLISH AND NO METAL PINS OR CLIPS IN YOUR HAIR. CONTACT LENS, DENTURES / PARTIALS, GLASSES SHOULD NOT BE WORN TO SURGERY AND IN MOST CASES-HEARING AIDS WILL NEED TO BE REMOVED.  BRING YOUR GLASSES CASE, ANY EQUIPMENT NEEDED FOR YOUR CONTACT LENS. FOR PATIENTS ADMITTED TO THE HOSPITAL--CHECK OUT TIME THE DAY OF DISCHARGE IS 11:00 AM.  ALL INPATIENT ROOMS ARE PRIVATE - WITH BATHROOM, TELEPHONE, TELEVISION AND WIFI INTERNET.  IF YOU ARE BEING DISCHARGED THE SAME DAY OF YOUR SURGERY--YOU CAN NOT DRIVE YOURSELF HOME--AND SHOULD NOT GO HOME ALONE BY TAXI OR BUS.  NO DRIVING OR OPERATING MACHINERY FOR 24 HOURS FOLLOWING ANESTHESIA / PAIN MEDICATIONS.  PLEASE MAKE ARRANGEMENTS FOR SOMEONE TO BE WITH YOU AT HOME THE FIRST 24 HOURS AFTER SURGERY. RESPONSIBLE DRIVER'S NAME  ELIZABETH HOELER - WILL BE WITH PT DAY OF SURGERY                                                                               FAILURE TO FOLLOW THESE INSTRUCTIONS MAY RESULT IN THE CANCELLATION OF YOUR SURGERY.   PATIENT SIGNATURE_________________________________

## 2013-05-31 NOTE — Pre-Procedure Instructions (Signed)
EKG AND CARDIOLOGY OFFICE NOTES DR. HOCHREIN IN EPIC - PT STATES SHE IS SCHEDULED FOR NUCLEAR STRESS TEST Tuesday 06/01/13 - FOR CLEARANCE FOR PLANNED UROLOGY SURGERY ON 9/15. CXR WAS DONE TODAY PREOP - AT [facility].

## 2013-06-01 ENCOUNTER — Ambulatory Visit (HOSPITAL_COMMUNITY): Payer: BC Managed Care – PPO | Attending: Family Medicine | Admitting: Radiology

## 2013-06-01 ENCOUNTER — Other Ambulatory Visit (INDEPENDENT_AMBULATORY_CARE_PROVIDER_SITE_OTHER): Payer: BC Managed Care – PPO

## 2013-06-01 VITALS — BP 141/86 | HR 55 | Ht 62.0 in | Wt 142.0 lb

## 2013-06-01 DIAGNOSIS — R079 Chest pain, unspecified: Secondary | ICD-10-CM

## 2013-06-01 DIAGNOSIS — Z01810 Encounter for preprocedural cardiovascular examination: Secondary | ICD-10-CM | POA: Insufficient documentation

## 2013-06-01 DIAGNOSIS — Z8249 Family history of ischemic heart disease and other diseases of the circulatory system: Secondary | ICD-10-CM | POA: Insufficient documentation

## 2013-06-01 DIAGNOSIS — Z13220 Encounter for screening for lipoid disorders: Secondary | ICD-10-CM

## 2013-06-01 DIAGNOSIS — R0609 Other forms of dyspnea: Secondary | ICD-10-CM | POA: Insufficient documentation

## 2013-06-01 DIAGNOSIS — R0602 Shortness of breath: Secondary | ICD-10-CM | POA: Insufficient documentation

## 2013-06-01 DIAGNOSIS — R0989 Other specified symptoms and signs involving the circulatory and respiratory systems: Secondary | ICD-10-CM | POA: Insufficient documentation

## 2013-06-01 DIAGNOSIS — F17200 Nicotine dependence, unspecified, uncomplicated: Secondary | ICD-10-CM | POA: Insufficient documentation

## 2013-06-01 DIAGNOSIS — R0789 Other chest pain: Secondary | ICD-10-CM | POA: Insufficient documentation

## 2013-06-01 LAB — LIPID PANEL
Total CHOL/HDL Ratio: 5
Triglycerides: 183 mg/dL — ABNORMAL HIGH (ref 0.0–149.0)

## 2013-06-01 MED ORDER — REGADENOSON 0.4 MG/5ML IV SOLN
0.4000 mg | Freq: Once | INTRAVENOUS | Status: AC
  Administered 2013-06-01: 0.4 mg via INTRAVENOUS

## 2013-06-01 MED ORDER — TECHNETIUM TC 99M SESTAMIBI GENERIC - CARDIOLITE
11.0000 | Freq: Once | INTRAVENOUS | Status: AC | PRN
  Administered 2013-06-01: 11 via INTRAVENOUS

## 2013-06-01 MED ORDER — TECHNETIUM TC 99M SESTAMIBI GENERIC - CARDIOLITE
33.0000 | Freq: Once | INTRAVENOUS | Status: AC | PRN
  Administered 2013-06-01: 33 via INTRAVENOUS

## 2013-06-01 NOTE — Progress Notes (Signed)
[facility] SITE 3 NUCLEAR MED [address] [place], [place] [address] [phone]    Cardiology Nuclear Med Study  Kim Wagner is a 57 y.o. female     MRN : [id number]     DOB: Oct 25, 1955  Procedure Date: 06/01/2013  Nuclear Med Background Indication for Stress Test:  Evaluation for Ischemia and Pending Surgical Clearance for  Kidney Stone on 06/07/13 by Dr. Natalia Leatherwood  History:  0/15/14 GXT: Non diagnostic Cardiac Risk Factors: Family History - CAD and Smoker  Symptoms:Chest Pressure with exertion (last episode of chest pain was about 2-weeks ago) and   DOE/SOB   Nuclear Pre-Procedure Caffeine/Decaff Intake:  None > 12 hrs NPO After: 10:00pm   Lungs:  Clear. O2 Sat: 95% on room air. IV 0.9% NS with Angio Cath:  22g  IV Site: R Antecubital x 1, tolerated well IV Started by:  Irean Hong, RN  Chest Size (in):  36 Cup Size: B  Height: 5\' 2"  (1.575 m)  Weight:  142 lb (64.411 kg)  BMI:  Body mass index is 25.97 kg/(m^2). Tech Comments:  n/a    Nuclear Med Study 1 or 2 day study: 1 day  Stress Test Type:  Treadmill/Lexiscan  Reading MD: Charlton Haws, MD  Order Authorizing Provider:  Rollene Rotunda, MD  Resting Radionuclide: Technetium 50m Sestamibi  Resting Radionuclide Dose: 11.0 mCi   Stress Radionuclide:  Technetium 81m Sestamibi  Stress Radionuclide Dose: 33.0 mCi           Stress Protocol Rest HR: 55 Stress HR: 76  Rest BP: 141/86 Stress BP: 115/66  Exercise Time (min): 2:00 METS: n/a   Predicted Max HR: 161 bpm % Max HR: 47.2 bpm Rate Pressure Product: 8740   Dose of Adenosine (mg):  n/a Dose of Lexiscan: 0.4 mg  Dose of Atropine (mg): n/a Dose of Dobutamine: n/a mcg/kg/min (at max HR)  Stress Test Technologist: Smiley Houseman, CMA-N  Nuclear Technologist:  Domenic Polite, CNMT     Rest Procedure:  Myocardial perfusion imaging was performed at rest 45 minutes following the intravenous administration of Technetium 21m Sestamibi.  Rest ECG:  NSR - Normal EKG  Stress Procedure:  The patient received IV Lexiscan 0.4 mg over 15-seconds with concurrent low level exercise and then Technetium 37m Sestamibi was injected at 30-seconds while the patient continued walking one more minute.  Patient had a hypotensive response with Lexiscan, but was not symptomatic.  Quantitative spect images were obtained after a 45-minute delay.  Stress ECG: No significant change from baseline ECG  QPS Raw Data Images:  Normal; no motion artifact; normal heart/lung ratio. Stress Images:  Normal homogeneous uptake in all areas of the myocardium. Rest Images:  Normal homogeneous uptake in all areas of the myocardium. Subtraction (SDS):  Normal Transient Ischemic Dilatation (Normal <1.22):  n/a Lung/Heart Ratio (Normal <0.45):  0.40  Quantitative Gated Spect Images QGS EDV:  107 ml QGS ESV:  36 ml  Impression Exercise Capacity:  Lexiscan with low level exercise. BP Response:  Normal blood pressure response. Clinical Symptoms:  No significant symptoms noted. ECG Impression:  No significant ST segment change suggestive of ischemia. Comparison with Prior Nuclear Study: No previous nuclear study performed  Overall Impression:  Normal stress nuclear study.  LV Ejection Fraction: 66%.  LV Wall Motion:  NL LV Function; NL Wall Motion  Charlton Haws

## 2013-06-03 NOTE — Pre-Procedure Instructions (Signed)
PT'S STRESS TEST REPORT 06/01/13 IN EPIC - NORMAL

## 2013-06-04 NOTE — Progress Notes (Signed)
Called patient at home number and was told patient at work  Was recording at other phone number listed. Will report to short stay nurses that I have not been able to contact patient for later surgery time

## 2013-06-07 ENCOUNTER — Ambulatory Visit (HOSPITAL_COMMUNITY): Payer: BC Managed Care – PPO | Admitting: Anesthesiology

## 2013-06-07 ENCOUNTER — Ambulatory Visit (HOSPITAL_COMMUNITY)
Admission: RE | Admit: 2013-06-07 | Discharge: 2013-06-07 | Disposition: A | Discharge status: Home / Self Care | Payer: BC Managed Care – PPO | Source: Ambulatory Visit | Attending: Urology | Admitting: Urology

## 2013-06-07 ENCOUNTER — Encounter (HOSPITAL_COMMUNITY)
Admission: RE | Disposition: A | Discharge status: Home / Self Care | Payer: Self-pay | Source: Ambulatory Visit | Attending: Urology

## 2013-06-07 ENCOUNTER — Encounter (HOSPITAL_COMMUNITY): Payer: Self-pay | Admitting: Anesthesiology

## 2013-06-07 ENCOUNTER — Encounter (HOSPITAL_COMMUNITY): Payer: Self-pay | Admitting: *Deleted

## 2013-06-07 ENCOUNTER — Ambulatory Visit (HOSPITAL_COMMUNITY): Payer: BC Managed Care – PPO

## 2013-06-07 DIAGNOSIS — N200 Calculus of kidney: Secondary | ICD-10-CM

## 2013-06-07 DIAGNOSIS — F17200 Nicotine dependence, unspecified, uncomplicated: Secondary | ICD-10-CM | POA: Insufficient documentation

## 2013-06-07 DIAGNOSIS — R310 Gross hematuria: Secondary | ICD-10-CM

## 2013-06-07 DIAGNOSIS — N201 Calculus of ureter: Secondary | ICD-10-CM

## 2013-06-07 DIAGNOSIS — R0602 Shortness of breath: Secondary | ICD-10-CM | POA: Insufficient documentation

## 2013-06-07 HISTORY — PX: CYSTOSCOPY WITH RETROGRADE PYELOGRAM, URETEROSCOPY AND STENT PLACEMENT: SHX5789

## 2013-06-07 HISTORY — PX: HOLMIUM LASER APPLICATION: SHX5852

## 2013-06-07 SURGERY — CYSTOURETEROSCOPY, WITH RETROGRADE PYELOGRAM AND STENT INSERTION
Anesthesia: General | Laterality: Left

## 2013-06-07 MED ORDER — FENTANYL CITRATE 0.05 MG/ML IJ SOLN: 25.0000 ug | INTRAMUSCULAR | Status: DC | PRN

## 2013-06-07 MED ORDER — LIDOCAINE HCL 2 % EX GEL
CUTANEOUS | Status: AC
  Filled 2013-06-07: qty 10

## 2013-06-07 MED ORDER — OXYCODONE-ACETAMINOPHEN 5-325 MG PO TABS: 1.0000 | ORAL_TABLET | ORAL | Status: DC | PRN

## 2013-06-07 MED ORDER — ONDANSETRON HCL 4 MG/2ML IJ SOLN
INTRAMUSCULAR | Status: DC | PRN
  Administered 2013-06-07: 4 mg via INTRAVENOUS

## 2013-06-07 MED ORDER — BELLADONNA ALKALOIDS-OPIUM 16.2-60 MG RE SUPP
RECTAL | Status: AC
  Filled 2013-06-07: qty 1

## 2013-06-07 MED ORDER — LIDOCAINE HCL 2 % EX GEL
CUTANEOUS | Status: DC | PRN
  Administered 2013-06-07: 1 via URETHRAL

## 2013-06-07 MED ORDER — MIDAZOLAM HCL 5 MG/5ML IJ SOLN
INTRAMUSCULAR | Status: DC | PRN
  Administered 2013-06-07: 2 mg via INTRAVENOUS

## 2013-06-07 MED ORDER — PROPOFOL 10 MG/ML IV BOLUS
INTRAVENOUS | Status: DC | PRN
  Administered 2013-06-07: 100 mg via INTRAVENOUS

## 2013-06-07 MED ORDER — SODIUM CHLORIDE 0.9 % IR SOLN
Status: DC | PRN
  Administered 2013-06-07: 4000 mL

## 2013-06-07 MED ORDER — SENNOSIDES-DOCUSATE SODIUM 8.6-50 MG PO TABS: 1.0000 | ORAL_TABLET | Freq: Two times a day (BID) | ORAL | Status: DC

## 2013-06-07 MED ORDER — CIPROFLOXACIN HCL 500 MG PO TABS: 500.0000 mg | ORAL_TABLET | Freq: Two times a day (BID) | ORAL | Status: DC

## 2013-06-07 MED ORDER — EPHEDRINE SULFATE 50 MG/ML IJ SOLN
INTRAMUSCULAR | Status: DC | PRN
  Administered 2013-06-07: 10 mg via INTRAVENOUS

## 2013-06-07 MED ORDER — IOHEXOL 350 MG/ML SOLN
INTRAVENOUS | Status: DC | PRN
  Administered 2013-06-07: 20 mL

## 2013-06-07 MED ORDER — FENTANYL CITRATE 0.05 MG/ML IJ SOLN
INTRAMUSCULAR | Status: DC | PRN
  Administered 2013-06-07: 50 ug via INTRAVENOUS

## 2013-06-07 MED ORDER — LIDOCAINE HCL (CARDIAC) 20 MG/ML IV SOLN
INTRAVENOUS | Status: DC | PRN
  Administered 2013-06-07: 50 mg via INTRAVENOUS

## 2013-06-07 MED ORDER — CIPROFLOXACIN IN D5W 400 MG/200ML IV SOLN
400.0000 mg | INTRAVENOUS | Status: AC
  Administered 2013-06-07: 400 mg via INTRAVENOUS

## 2013-06-07 MED ORDER — LACTATED RINGERS IV SOLN
INTRAVENOUS | Status: DC
  Administered 2013-06-07: 1000 mL via INTRAVENOUS

## 2013-06-07 MED ORDER — PROMETHAZINE HCL 25 MG/ML IJ SOLN: 6.2500 mg | INTRAMUSCULAR | Status: DC | PRN

## 2013-06-07 MED ORDER — BELLADONNA ALKALOIDS-OPIUM 16.2-60 MG RE SUPP
RECTAL | Status: DC | PRN
  Administered 2013-06-07: 1 via RECTAL

## 2013-06-07 SURGICAL SUPPLY — 40 items
BAG URO CATCHER STRL LF (DRAPE) ×2 IMPLANT
BASKET LASER NITINOL 1.9FR (BASKET) IMPLANT
BASKET STNLS GEMINI 4WIRE 3FR (BASKET) IMPLANT
BASKET ZERO TIP NITINOL 2.4FR (BASKET) ×1 IMPLANT
BRUSH URET BIOPSY 3F (UROLOGICAL SUPPLIES) IMPLANT
BSKT STON RTRVL 120 1.9FR (BASKET)
BSKT STON RTRVL GEM 120X11 3FR (BASKET)
BSKT STON RTRVL ZERO TP 2.4FR (BASKET) ×1
CATH CLEAR GEL 3F BACKSTOP (CATHETERS) IMPLANT
CATH ROBINSON RED A/P 14FR (CATHETERS) ×1 IMPLANT
CATH URET 5FR 28IN CONE TIP (BALLOONS)
CATH URET 5FR 28IN OPEN ENDED (CATHETERS) ×2 IMPLANT
CATH URET 5FR 70CM CONE TIP (BALLOONS) IMPLANT
CATH URET DUAL LUMEN 6-10FR 50 (CATHETERS) IMPLANT
CLOTH BEACON ORANGE TIMEOUT ST (SAFETY) ×2 IMPLANT
DRAPE CAMERA CLOSED 9X96 (DRAPES) ×2 IMPLANT
FIBER LASER TRAC TIP (UROLOGICAL SUPPLIES) ×1 IMPLANT
GLOVE BIOGEL M 7.0 STRL (GLOVE) IMPLANT
GLOVE ECLIPSE 7.0 STRL STRAW (GLOVE) ×4 IMPLANT
GLOVE SURG SS PI 8.0 STRL IVOR (GLOVE) ×1 IMPLANT
GOWN PREVENTION PLUS LG XLONG (DISPOSABLE) ×2 IMPLANT
GOWN PREVENTION PLUS XLARGE (GOWN DISPOSABLE) ×1 IMPLANT
GOWN STRL REIN XL XLG (GOWN DISPOSABLE) ×2 IMPLANT
GUIDEWIRE ANG ZIPWIRE 038X150 (WIRE) IMPLANT
GUIDEWIRE STR DUAL SENSOR (WIRE) ×2 IMPLANT
IV NS IRRIG 3000ML ARTHROMATIC (IV SOLUTION) ×2 IMPLANT
KIT BALLIN UROMAX 15FX10 (LABEL) IMPLANT
KIT BALLN UROMAX 15FX4 (MISCELLANEOUS) IMPLANT
KIT BALLN UROMAX 26 75X4 (MISCELLANEOUS)
LASER FIBER DISP (UROLOGICAL SUPPLIES) IMPLANT
MANIFOLD NEPTUNE II (INSTRUMENTS) ×2 IMPLANT
MARKER SKIN DUAL TIP RULER LAB (MISCELLANEOUS) ×1 IMPLANT
PACK CYSTO (CUSTOM PROCEDURE TRAY) ×2 IMPLANT
SCRUB PCMX 4 OZ (MISCELLANEOUS) ×1 IMPLANT
SET HIGH PRES BAL DIL (LABEL)
SHEATH ACCESS URETERAL 38CM (SHEATH) IMPLANT
SHEATH URET ACCESS 12FR/35CM (UROLOGICAL SUPPLIES) IMPLANT
SHEATH URET ACCESS 12FR/55CM (UROLOGICAL SUPPLIES) IMPLANT
SYRINGE IRR TOOMEY STRL 70CC (SYRINGE) IMPLANT
TUBING CONNECTING 10 (TUBING) ×2 IMPLANT

## 2013-06-07 NOTE — Transfer of Care (Signed)
Immediate Anesthesia Transfer of Care Note  Patient: Kim Wagner  Procedure(s) Performed: Procedure(s) with comments: CYSTOSCOPY, BILATERAL RETROGRADE PYELOGRAM, LEFT URETEROSCOPY, LEFT LASER LITHOTRIPSY, LEFT URETER STENT EXCHANGE (Left) - CYSTOSCOPY, BILATERAL RETROGRADE PYELOGRAM, LEFT URETEROSCOPY, LEFT LASER LITHOTRIPSY, LEFT URETER STENT EXCHANGE  HOLMIUM LASER APPLICATION (Left)  Patient Location: PACU  Anesthesia Type:General  Level of Consciousness: sedated and patient cooperative  Airway & Oxygen Therapy: Patient Spontanous Breathing and Patient connected to face mask oxygen  Post-op Assessment: Report given to PACU RN and Post -op Vital signs reviewed and stable  Post vital signs: Reviewed and stable  Complications: No apparent anesthesia complications

## 2013-06-07 NOTE — H&P (Signed)
Urology History and Physical Exam  CC: Left ureter stone, left nephrolithiasis, gross hematuria.  HPI:  57 year old female presents today for a left proximal ureter stone, left nephrolithiasis, and gross hematuria.  This was discovered on CT scan 04/18/13.  This was 0.7 cm in size.  It was associated with proximal hydronephrosis.  She also has left-sided nephrolithiasis.  This is located in the lower to mid pole of the left kidney.  There is a punctate stone as well as approximately 3 mm stone.  She had a urinary tract infection which was associated with Escherichia coli sensitive to ciprofloxacin.  She was treated with this antibiotic and had a left ureter stent placement 04/18/13.  Follow-up culture from 04/23/13 showed no growth.  She returned and we discussed management options along with risks, benefits, alternatives, and side effects.  She presents today for cystoscopy, bilateral retrograde pyelogram, left ureteroscopy, laser lithotripsy, left ureter stent exchange.  The retrograde pyelograms will be done for gross hematuria.  PMH: Past Medical History  Diagnosis Date  . History of kidney stones   . Chest discomfort     ONLY "IF RUNNING TO PLAY WITH GRANDCHILDREN" - PT IS A SMOKER AND HAS STRONG FAMILY HX OF HEART PROBLEMS AND WILL HAVE NUCLEAR STRESS TEST TUESDAY 06/01/13 FOR CLEARANCE FOR PLANNED URETEROSCOPY, LASER LITHO SURGERY ON 9/15.    PSH: Past Surgical History  Procedure Laterality Date  . Abdominal hysterectomy    . Shoulder surgery Left   . Right ankle surgery    . Cystoscopy w/ retrogrades Left 04/18/2013    Procedure: CYSTOSCOPY WITH LEFT RETROGRADE PYELOGRAM; URETER STENT PLACEMENT;  Surgeon: Milford Cage, MD;  Location: [facility] OR;  Service: Urology;  Laterality: Left;    Allergies: Allergies  Allergen Reactions  . Penicillins Rash    Medications: No prescriptions prior to admission     Social History: History   Social History  . Marital Status: Divorced     Spouse Name: N/A    Number of Children: 1  . Years of Education: N/A   Occupational History  .     Social History Main Topics  . Smoking status: Current Every Day Smoker -- 1.00 packs/day for 40 years    Types: Cigarettes  . Smokeless tobacco: Never Used  . Alcohol Use: No  . Drug Use: No  . Sexual Activity: Not Currently   Other Topics Concern  . Not on file   Social History Narrative   Works in a [employer], dispensing.  Lives with daughter.     Family History: Family History  Problem Relation Age of Onset  . Stroke    . CAD Mother 70  . CAD Brother 57  . CAD Brother 48    Review of Systems: Positive: None Negative: Chest pain, SOB, or fever.  A further 10 point review of systems was negative except what is listed in the HPI.  Physical Exam: Filed Vitals:   06/07/13 1201  BP: 128/74  Pulse: 77  Temp: 98.5 F (36.9 C)  Resp: 20    General: No acute distress.  Awake. Head:  Normocephalic.  Atraumatic. ENT:  EOMI.  Mucous membranes moist Neck:  Supple.  No lymphadenopathy. CV:  S1 present. S2 present. Regular rate. Pulmonary: Equal effort bilaterally.  Clear to auscultation bilaterally. Abdomen: Soft.  Non- tender to palpation. Skin:  Normal turgor.  No visible rash. Extremity: No gross deformity of bilateral upper extremities.  No gross deformity of    bilateral lower extremities.  Neurologic: Alert. Appropriate mood.    Studies:  No results found for this basename: HGB, WBC, PLT,  in the last 72 hours  No results found for this basename: NA, K, CL, CO2, BUN, CREATININE, CALCIUM, MAGNESIUM, GFRNONAA, GFRAA,  in the last 72 hours   No results found for this basename: PT, INR, APTT,  in the last 72 hours   No components found with this basename: ABG,     Assessment:  Left ureter stone, left nephrolithiasis, gross hematuria.  Plan: To OR  for cystoscopy, bilateral retrograde pyelogram, left ureteroscopy, laser lithotripsy, left ureter stent  exchange.  We plan on taking care of the ureter stone as well as the kidney stones as well as evaluating her for gross hematuria.

## 2013-06-07 NOTE — Op Note (Signed)
Urology Operative Report  Date of Procedure: 06/07/13  Surgeon: Natalia Leatherwood, MD Assistant:  None  Preoperative Diagnosis: Left ureter stone, left nephrolithiasis, gross hematuria. Postoperative Diagnosis: Left ureter stone, left nephrolithiasis, gross hematuria.  Procedure(s):  Cystoscopy Bilateral retrograde pyelogram with interpretation Left ureteroscopy with laser lithotripsy Removal of left kidney stone Left ureter stent removal  Estimated blood loss: None  Specimen: Stones sent to [facility] lab for analysis.  Drains: None  Complications: None  Findings: Left ureter stone  Left renal stone  Negative filling defects in the ureters bilaterally. Negative bladder tumors.  History of present illness: Patient presented to the ER with an obstructing left ureter stone. She also gross hematuria. She a left ureter stent placement presents today for interval ureteroscopy. She'll left ureter stone as well as a left renal stone.   Procedure in detail: After informed consent was obtained, the patient was taken to the operating room. They were placed in the supine position. SCDs were turned on and in place. IV antibiotics were infused, and general anesthesia was induced. A timeout was performed in which the correct patient, surgical site, and procedure were identified and agreed upon by the team.  The patient was placed in a dorsolithotomy position, making sure to pad all pertinent neurovascular pressure points. The genitals were prepped and draped in the usual sterile fashion.  A cystoscope was advanced through the urethra and into the bladder. The bladder was then drained. It was then fully distended and evaluated in a systematic fashion to evaluate the entire surface of the bladder. This was negative for bladder tumors.   Attention was turned to the right ureter. It was cannulated with a 5 [place] ureter catheter. I injected contrast to obtain a retrograde PolyGram. This was negative  for hydronephrosis or filling defects. It indeed out well.  Attention was turned to the left ureter orifice. It was cannulated with a sensor tip wire. It was noted that this wire pushed the proximal ureter stone into the renal pelvis. The wire was secured to the drape as a safety wire. A stent grasper was used to remove the left ureter stent. I then obtained a left retrograde pyelogram by injecting contrast through a 5 [place] ureter catheter into the left ureter. This was negative for filling defects, but there was noted to be mild dilation of the left ureter.  A flexible digital ureter scope was then advanced the urethra and into the left ureter. I was able to navigate this up into the left renal pelvis. There were no tumors in the left ureter. The left renal pelvis was then evaluated in a systematic fashion to evaluate every calyx. There was noted to be a long infundibulum to the superior pole calyx, there was also noted to be a complex calyx in the superior pole and inferior pole. This was negative for tumors. The stone that had been in the left ureter was found in the renal pelvis which had been pushed back from the ureter into the renal pelvis. There is also a left lower pole stone which was seen on CT scan. The left lower pole stone was grasped with a 0 tip Nitinol basket and removed from the kidney. It should be noted that this was a separate anatomic location of the ureter stone.  Laser lithotripsy was then carried out with a 200  holmium laser filament at settings of 0.5 J and 20 Hz to fragment the ureter stone which had been moved into the left renal pelvis. Once  these were small enough to be removed there were removed with a 0 tip Nitinol basket. All large fragments were removed. The only thing left was best. The safety wire was removed and the ureter scope was removed. Entire length of the ureter was visualized and there was no injury to the ureter.  Her bladder was drained of stone fragments  which were taken to the [facility] lab for chemical analysis. This completed the procedure. I placed 10 cc of lidocaine jelly into the urethra and a B&O suppository into the rectum. She's placed back in a supine position, anesthesia was reversed, and she was taken to the PACU in stable condition.  All counts were correct at the end of the case.

## 2013-06-07 NOTE — Anesthesia Postprocedure Evaluation (Signed)
  Anesthesia Post-op Note  Patient: Kim Wagner  Procedure(s) Performed: Procedure(s) (LRB): CYSTOSCOPY, BILATERAL RETROGRADE PYELOGRAM, LEFT URETEROSCOPY, LEFT LASER LITHOTRIPSY, LEFT URETER STENT EXCHANGE (Left) HOLMIUM LASER APPLICATION (Left)  Patient Location: PACU  Anesthesia Type: General  Level of Consciousness: awake and alert   Airway and Oxygen Therapy: Patient Spontanous Breathing  Post-op Pain: mild  Post-op Assessment: Post-op Vital signs reviewed, Patient's Cardiovascular Status Stable, Respiratory Function Stable, Patent Airway and No signs of Nausea or vomiting  Last Vitals:  Filed Vitals:   06/07/13 1611  BP: 133/74  Pulse: 66  Temp:   Resp: 14    Post-op Vital Signs: stable   Complications: No apparent anesthesia complications

## 2013-06-07 NOTE — Anesthesia Preprocedure Evaluation (Signed)
Anesthesia Evaluation  Patient identified by MRN, date of birth, ID band Patient awake    Reviewed: Allergy & Precautions, H&P , NPO status , Patient's Chart, lab work & pertinent test results  Airway Mallampati: II TM Distance: >3 FB Neck ROM: Full    Dental no notable dental hx.    Pulmonary shortness of breath, Current Smoker,  breath sounds clear to auscultation  Pulmonary exam normal       Cardiovascular Exercise Tolerance: Good negative cardio ROS  Rhythm:Regular Rate:Normal     Neuro/Psych negative neurological ROS  negative psych ROS   GI/Hepatic negative GI ROS, Neg liver ROS,   Endo/Other  negative endocrine ROS  Renal/GU negative Renal ROS  negative genitourinary   Musculoskeletal negative musculoskeletal ROS (+)   Abdominal   Peds negative pediatric ROS (+)  Hematology negative hematology ROS (+)   Anesthesia Other Findings   Reproductive/Obstetrics negative OB ROS                           Anesthesia Physical Anesthesia Plan  ASA: II  Anesthesia Plan: General   Post-op Pain Management:    Induction: Intravenous  Airway Management Planned: LMA  Additional Equipment:   Intra-op Plan:   Post-operative Plan: Extubation in OR  Informed Consent: I have reviewed the patients History and Physical, chart, labs and discussed the procedure including the risks, benefits and alternatives for the proposed anesthesia with the patient or authorized representative who has indicated his/her understanding and acceptance.   Dental advisory given  Plan Discussed with: CRNA  Anesthesia Plan Comments:         Anesthesia Quick Evaluation

## 2013-06-08 ENCOUNTER — Encounter (HOSPITAL_COMMUNITY): Payer: Self-pay | Admitting: Urology

## 2014-07-05 ENCOUNTER — Institutional Professional Consult (permissible substitution): Payer: BC Managed Care – PPO | Admitting: Critical Care Medicine

## 2014-08-09 ENCOUNTER — Institutional Professional Consult (permissible substitution): Payer: BC Managed Care – PPO | Admitting: Critical Care Medicine

## 2018-05-22 ENCOUNTER — Emergency Department (HOSPITAL_COMMUNITY): Payer: 59

## 2018-05-22 ENCOUNTER — Other Ambulatory Visit: Payer: Self-pay

## 2018-05-22 ENCOUNTER — Encounter (HOSPITAL_COMMUNITY): Payer: Self-pay

## 2018-05-22 ENCOUNTER — Inpatient Hospital Stay (HOSPITAL_COMMUNITY)
Admission: EM | Admit: 2018-05-22 | Discharge: 2018-05-27 | DRG: 291 | Disposition: A | Discharge status: Home Health | Payer: 59 | Attending: Internal Medicine | Admitting: Internal Medicine

## 2018-05-22 DIAGNOSIS — I5033 Acute on chronic diastolic (congestive) heart failure: Secondary | ICD-10-CM | POA: Diagnosis present

## 2018-05-22 DIAGNOSIS — J9811 Atelectasis: Secondary | ICD-10-CM | POA: Diagnosis present

## 2018-05-22 DIAGNOSIS — R0902 Hypoxemia: Secondary | ICD-10-CM | POA: Diagnosis not present

## 2018-05-22 DIAGNOSIS — W19XXXA Unspecified fall, initial encounter: Secondary | ICD-10-CM | POA: Diagnosis present

## 2018-05-22 DIAGNOSIS — K4190 Unilateral femoral hernia, without obstruction or gangrene, not specified as recurrent: Secondary | ICD-10-CM | POA: Diagnosis present

## 2018-05-22 DIAGNOSIS — Z66 Do not resuscitate: Secondary | ICD-10-CM | POA: Diagnosis present

## 2018-05-22 DIAGNOSIS — R748 Abnormal levels of other serum enzymes: Secondary | ICD-10-CM | POA: Diagnosis present

## 2018-05-22 DIAGNOSIS — R0600 Dyspnea, unspecified: Secondary | ICD-10-CM | POA: Diagnosis present

## 2018-05-22 DIAGNOSIS — N29 Other disorders of kidney and ureter in diseases classified elsewhere: Secondary | ICD-10-CM | POA: Diagnosis present

## 2018-05-22 DIAGNOSIS — N1330 Unspecified hydronephrosis: Secondary | ICD-10-CM | POA: Diagnosis present

## 2018-05-22 DIAGNOSIS — Z8249 Family history of ischemic heart disease and other diseases of the circulatory system: Secondary | ICD-10-CM

## 2018-05-22 DIAGNOSIS — R296 Repeated falls: Secondary | ICD-10-CM | POA: Diagnosis present

## 2018-05-22 DIAGNOSIS — G8929 Other chronic pain: Secondary | ICD-10-CM | POA: Diagnosis present

## 2018-05-22 DIAGNOSIS — I110 Hypertensive heart disease with heart failure: Secondary | ICD-10-CM | POA: Diagnosis not present

## 2018-05-22 DIAGNOSIS — R634 Abnormal weight loss: Secondary | ICD-10-CM | POA: Diagnosis present

## 2018-05-22 DIAGNOSIS — I509 Heart failure, unspecified: Secondary | ICD-10-CM

## 2018-05-22 DIAGNOSIS — Z6823 Body mass index (BMI) 23.0-23.9, adult: Secondary | ICD-10-CM

## 2018-05-22 DIAGNOSIS — D509 Iron deficiency anemia, unspecified: Secondary | ICD-10-CM | POA: Diagnosis present

## 2018-05-22 DIAGNOSIS — Z716 Tobacco abuse counseling: Secondary | ICD-10-CM

## 2018-05-22 DIAGNOSIS — Q615 Medullary cystic kidney: Secondary | ICD-10-CM

## 2018-05-22 DIAGNOSIS — K5730 Diverticulosis of large intestine without perforation or abscess without bleeding: Secondary | ICD-10-CM | POA: Diagnosis present

## 2018-05-22 DIAGNOSIS — R600 Localized edema: Secondary | ICD-10-CM | POA: Diagnosis present

## 2018-05-22 DIAGNOSIS — M549 Dorsalgia, unspecified: Secondary | ICD-10-CM | POA: Diagnosis present

## 2018-05-22 DIAGNOSIS — R413 Other amnesia: Secondary | ICD-10-CM | POA: Diagnosis present

## 2018-05-22 DIAGNOSIS — Z880 Allergy status to penicillin: Secondary | ICD-10-CM

## 2018-05-22 DIAGNOSIS — Z7982 Long term (current) use of aspirin: Secondary | ICD-10-CM

## 2018-05-22 DIAGNOSIS — Z79899 Other long term (current) drug therapy: Secondary | ICD-10-CM

## 2018-05-22 DIAGNOSIS — E8770 Fluid overload, unspecified: Secondary | ICD-10-CM | POA: Diagnosis present

## 2018-05-22 DIAGNOSIS — D649 Anemia, unspecified: Secondary | ICD-10-CM | POA: Diagnosis present

## 2018-05-22 DIAGNOSIS — E871 Hypo-osmolality and hyponatremia: Secondary | ICD-10-CM | POA: Diagnosis present

## 2018-05-22 DIAGNOSIS — Z90710 Acquired absence of both cervix and uterus: Secondary | ICD-10-CM

## 2018-05-22 DIAGNOSIS — Y92009 Unspecified place in unspecified non-institutional (private) residence as the place of occurrence of the external cause: Secondary | ICD-10-CM

## 2018-05-22 DIAGNOSIS — J9601 Acute respiratory failure with hypoxia: Secondary | ICD-10-CM | POA: Diagnosis present

## 2018-05-22 DIAGNOSIS — Z87442 Personal history of urinary calculi: Secondary | ICD-10-CM

## 2018-05-22 DIAGNOSIS — F17210 Nicotine dependence, cigarettes, uncomplicated: Secondary | ICD-10-CM | POA: Diagnosis present

## 2018-05-22 DIAGNOSIS — I272 Pulmonary hypertension, unspecified: Secondary | ICD-10-CM | POA: Diagnosis present

## 2018-05-22 DIAGNOSIS — R410 Disorientation, unspecified: Secondary | ICD-10-CM | POA: Diagnosis present

## 2018-05-22 LAB — CBC WITH DIFFERENTIAL/PLATELET
BASOS PCT: 1 %
Basophils Absolute: 0.1 10*3/uL (ref 0.0–0.1)
Eosinophils Absolute: 0.1 10*3/uL (ref 0.0–0.7)
Eosinophils Relative: 1 %
HEMATOCRIT: 33.3 % — AB (ref 36.0–46.0)
HEMOGLOBIN: 9.7 g/dL — AB (ref 12.0–15.0)
LYMPHS PCT: 22 %
Lymphs Abs: 2.4 10*3/uL (ref 0.7–4.0)
MCH: 22.2 pg — AB (ref 26.0–34.0)
MCHC: 29.1 g/dL — AB (ref 30.0–36.0)
MCV: 76.2 fL — AB (ref 78.0–100.0)
MONOS PCT: 10 %
Monocytes Absolute: 1.1 10*3/uL — ABNORMAL HIGH (ref 0.1–1.0)
NEUTROS ABS: 7 10*3/uL (ref 1.7–7.7)
Neutrophils Relative %: 66 %
Platelets: 363 10*3/uL (ref 150–400)
RBC: 4.37 MIL/uL (ref 3.87–5.11)
RDW: 20.7 % — ABNORMAL HIGH (ref 11.5–15.5)
WBC: 10.7 10*3/uL — ABNORMAL HIGH (ref 4.0–10.5)

## 2018-05-22 LAB — URINALYSIS, ROUTINE W REFLEX MICROSCOPIC
Bilirubin Urine: NEGATIVE
GLUCOSE, UA: NEGATIVE mg/dL
Hgb urine dipstick: NEGATIVE
Ketones, ur: NEGATIVE mg/dL
LEUKOCYTES UA: NEGATIVE
Nitrite: NEGATIVE
PH: 6 (ref 5.0–8.0)
PROTEIN: NEGATIVE mg/dL
SPECIFIC GRAVITY, URINE: 1.015 (ref 1.005–1.030)

## 2018-05-22 LAB — COMPREHENSIVE METABOLIC PANEL
ALK PHOS: 159 U/L — AB (ref 38–126)
ALT: 11 U/L (ref 0–44)
AST: 14 U/L — AB (ref 15–41)
Albumin: 3 g/dL — ABNORMAL LOW (ref 3.5–5.0)
Anion gap: 9 (ref 5–15)
BILIRUBIN TOTAL: 0.6 mg/dL (ref 0.3–1.2)
BUN: 9 mg/dL (ref 8–23)
CO2: 31 mmol/L (ref 22–32)
CREATININE: 0.46 mg/dL (ref 0.44–1.00)
Calcium: 8.6 mg/dL — ABNORMAL LOW (ref 8.9–10.3)
Chloride: 87 mmol/L — ABNORMAL LOW (ref 98–111)
Glucose, Bld: 95 mg/dL (ref 70–99)
Potassium: 3.8 mmol/L (ref 3.5–5.1)
Sodium: 127 mmol/L — ABNORMAL LOW (ref 135–145)
TOTAL PROTEIN: 5.4 g/dL — AB (ref 6.5–8.1)

## 2018-05-22 LAB — CBC
HEMATOCRIT: 31.7 % — AB (ref 36.0–46.0)
HEMOGLOBIN: 9.3 g/dL — AB (ref 12.0–15.0)
MCH: 22.3 pg — AB (ref 26.0–34.0)
MCHC: 29.3 g/dL — ABNORMAL LOW (ref 30.0–36.0)
MCV: 76 fL — AB (ref 78.0–100.0)
PLATELETS: 358 10*3/uL (ref 150–400)
RBC: 4.17 MIL/uL (ref 3.87–5.11)
RDW: 20.7 % — ABNORMAL HIGH (ref 11.5–15.5)
WBC: 10.4 10*3/uL (ref 4.0–10.5)

## 2018-05-22 LAB — RETICULOCYTES
RBC.: 4.37 MIL/uL (ref 3.87–5.11)
RETIC CT PCT: 1.5 % (ref 0.4–3.1)
Retic Count, Absolute: 65.6 10*3/uL (ref 19.0–186.0)

## 2018-05-22 LAB — IRON AND TIBC
IRON: 17 ug/dL — AB (ref 28–170)
Saturation Ratios: 4 % — ABNORMAL LOW (ref 10.4–31.8)
TIBC: 465 ug/dL — AB (ref 250–450)
UIBC: 448 ug/dL

## 2018-05-22 LAB — TROPONIN I: Troponin I: <0.03 ng/mL (ref <0.03)

## 2018-05-22 LAB — OSMOLALITY: Osmolality: 270 mOsm/kg — ABNORMAL LOW (ref 275–295)

## 2018-05-22 LAB — SODIUM, URINE, RANDOM: Sodium, Ur: 55 mmol/L

## 2018-05-22 LAB — VITAMIN B12: Vitamin B-12: 195 pg/mL (ref 180–914)

## 2018-05-22 LAB — FOLATE: Folate: 26 ng/mL (ref >5.9)

## 2018-05-22 LAB — CREATININE, URINE, RANDOM: Creatinine, Urine: 124.53 mg/dL

## 2018-05-22 LAB — FERRITIN: FERRITIN: 9 ng/mL — AB (ref 11–307)

## 2018-05-22 LAB — BRAIN NATRIURETIC PEPTIDE: B NATRIURETIC PEPTIDE 5: 353.5 pg/mL — AB (ref 0.0–100.0)

## 2018-05-22 LAB — OSMOLALITY, URINE: Osmolality, Ur: 449 mOsm/kg (ref 300–900)

## 2018-05-22 MED ORDER — FUROSEMIDE 10 MG/ML IJ SOLN
60.0000 mg | Freq: Once | INTRAMUSCULAR | Status: AC
  Administered 2018-05-22: 60 mg via INTRAVENOUS
  Filled 2018-05-22: qty 6

## 2018-05-22 MED ORDER — OXYCODONE-ACETAMINOPHEN 5-325 MG PO TABS
1.0000 | ORAL_TABLET | Freq: Once | ORAL | Status: AC
  Administered 2018-05-22: 1 via ORAL
  Filled 2018-05-22: qty 1

## 2018-05-22 NOTE — ED Notes (Signed)
Pt's oxygen level dropped to 83%. Tech went in and repositioned her nasal cannula and her sats were still in the low 80s. Patient's oxygen via nasal cannula was increased to 3L instead of 2 and sats came back up to 100%.

## 2018-05-22 NOTE — ED Notes (Signed)
Pt placed on 02 at 2LPM via [place] ref. Pt's 02 sats being 76%.  Pt is a smoker

## 2018-05-22 NOTE — ED Provider Notes (Signed)
[facility] EMERGENCY [facility]PARTMENT Provider Note   CSN: [id number] Arrival date & time: 05/22/18  1556     History   Chief Complaint Chief Complaint  Patient presents with  . Fall    HPI Enid SkeensGay Nield is a 62 y.o. female.  HPI 62 year old female presents the emergency department after developing increasing leg swelling and painful legs resulting in a generalized weakness today.  She was out with her grandchild and she was too weak to get up off the swing and was too weak to walk and therefore she began to crawl.  Family was alerted and she is brought to the ER for further evaluation.  She states that she is being seen by neurosurgery for history of spinal stenosis diagnosed on recent MRI.  She feels like over the past month she is been having worsening swelling in her legs and exertional shortness of breath.  No significant orthopnea.  Patient found to have O2 sats of 76% on arrival to the emergency department.  Longtime tobacco use.  No history of heart failure or heart disease.  No chest pain   Past Medical History:  Diagnosis Date  . Chest discomfort    ONLY "IF RUNNING TO PLAY WITH GRANDCHILDREN" - PT IS A SMOKER AND HAS STRONG FAMILY HX OF HEART PROBLEMS AND WILL HAVE NUCLEAR STRESS TEST TUESDAY 06/01/13 FOR CLEARANCE FOR PLANNED URETEROSCOPY, LASER LITHO SURGERY ON 9/15.  Marland Kitchen. History of kidney stones     Patient Active Problem List   Diagnosis Date Noted  . Dyspnea 05/31/2013  . Preop examination 05/31/2013    Past Surgical History:  Procedure Laterality Date  . ABDOMINAL HYSTERECTOMY    . CYSTOSCOPY W/ RETROGRADES Left 04/18/2013   Procedure: CYSTOSCOPY WITH LEFT RETROGRADE PYELOGRAM; URETER STENT PLACEMENT;  Surgeon: Milford Cageaniel Young Woodruff, MD;  [facility];  Service: Urology;  Laterality: Left;  . CYSTOSCOPY WITH RETROGRADE PYELOGRAM, URETEROSCOPY AND STENT PLACEMENT Left 06/07/2013   Procedure: CYSTOSCOPY, BILATERAL RETROGRADE PYELOGRAM, LEFT  URETEROSCOPY, LEFT LASER LITHOTRIPSY, LEFT URETER STENT EXCHANGE;  Surgeon: Milford Cageaniel Young Woodruff, MD;  Location: [facility] ORS;  Service: Urology;  Laterality: Left;  CYSTOSCOPY, BILATERAL RETROGRADE PYELOGRAM, LEFT URETEROSCOPY, LEFT LASER LITHOTRIPSY, LEFT URETER STENT EXCHANGE   . HOLMIUM LASER APPLICATION Left 06/07/2013   Procedure: HOLMIUM LASER APPLICATION;  Surgeon: Milford Cageaniel Young Woodruff, MD;  Location: [facility] ORS;  Service: Urology;  Laterality: Left;  . right ankle surgery    . SHOULDER SURGERY Left      OB History   None      Home Medications    Prior to Admission medications   Medication Sig Start Date End Date Taking? Authorizing Provider  aspirin 81 MG chewable tablet Chew 81 mg by mouth daily.      [provider]  ciprofloxacin (CIPRO) 500 MG tablet Take 1 tablet (500 mg total) by mouth 2 (two) times daily. 06/07/13   Natalia LeatherwoodWoodruff, Daniel, MD  oxyCODONE-acetaminophen (PERCOCET) 5-325 MG per tablet Take 1-2 tablets by mouth every 4 (four) hours as needed for pain. 06/07/13   Natalia LeatherwoodWoodruff, Daniel, MD  senna-docusate (SENOKOT S) 8.6-50 MG per tablet Take 1 tablet by mouth 2 (two) times daily. 06/07/13   Natalia LeatherwoodWoodruff, Daniel, MD    Family History Family History  Problem Relation Age of Onset  . Stroke Unknown   . CAD Mother 6057  . CAD Brother 754  . CAD Brother 448    Social History Social History   Tobacco Use  . Smoking status: Current  Every Day Smoker    Packs/day: 1.00    Years: 40.00    Pack years: 40.00    Types: Cigarettes  . Smokeless tobacco: Never Used  Substance Use Topics  . Alcohol use: No  . Drug use: No     Allergies   Penicillins   Review of Systems Review of Systems  All other systems reviewed and are negative.    Physical Exam Updated Vital Signs BP (!) 143/79   Pulse 85   Resp 16   Ht 5\' 4"  (1.626 m)   Wt 61.2 kg   SpO2 97%   BMI 23.17 kg/m   Physical Exam  Constitutional: She is oriented to person, place, and time. She appears  well-developed and well-nourished. No distress.  HENT:  Head: Normocephalic and atraumatic.  Eyes: EOM are normal.  Neck: Normal range of motion.  Cardiovascular: Normal rate, regular rhythm and normal heart sounds.  Pulmonary/Chest: Effort normal. She has rales.  Abdominal: Soft. She exhibits no distension. There is no tenderness.  Musculoskeletal: Normal range of motion. She exhibits edema.  Full range of motion bilateral hips, knees, ankles.  Full range of motion bilateral shoulders, elbows, wrist.  Neurological: She is alert and oriented to person, place, and time.  Skin: Skin is warm and dry.  Psychiatric: She has a normal mood and affect. Judgment normal.  Nursing note and vitals reviewed.    ED Treatments / Results  Labs (all labs ordered are listed, but only abnormal results are displayed) Labs Reviewed  CBC - Abnormal; Notable for the following components:      Result Value   Hemoglobin 9.3 (*)    HCT 31.7 (*)    MCV 76.0 (*)    MCH 22.3 (*)    MCHC 29.3 (*)    RDW 20.7 (*)    All other components within normal limits  COMPREHENSIVE METABOLIC PANEL  TROPONIN I  BRAIN NATRIURETIC PEPTIDE    EKG EKG Interpretation  Date/Time:  Friday May 22 2018 19:44:32 EDT Ventricular Rate:  82 PR Interval:    QRS Duration: 85 QT Interval:  383 QTC Calculation: 448 R Axis:   -40 Text Interpretation:  Sinus rhythm Prominent P waves, nondiagnostic Left axis deviation Low voltage, precordial leads No significant change was found Confirmed by Azalia Bilis ([id number]) on 05/22/2018 7:53:24 PM   Radiology Dg Chest 2 View  Result Date: 05/22/2018 CLINICAL DATA:  62 year old female with a history of weakness and shortness of breath EXAM: CHEST - 2 VIEW COMPARISON:  01/02/2018 FINDINGS: Cardiomediastinal silhouette unchanged in size and contour. No evidence of central vascular congestion. Patchy opacities at the lung bases with associated small pleural effusions. Chronic coarsening  of interstitial markings. No pneumothorax. Thoracic spine degenerative changes.  No acute displaced fracture. IMPRESSION: Bilateral small pleural effusions with associated atelectasis/consolidation. If there is concern for pneumonia, recommend correlation with lab values. Multilevel degenerative changes of the spine. Electronically Signed   By: Gilmer Mor D.O.   On: 05/22/2018 17:27    Procedures .Critical Care Performed by: Azalia Bilis, MD Authorized by: Azalia Bilis, MD     CRITICAL CARE Performed by: Azalia Bilis Total critical care time: 31 minutes Critical care time was exclusive of separately billable procedures and treating other patients. Critical care was necessary to treat or prevent imminent or life-threatening deterioration. Critical care was time spent personally by me on the following activities: development of treatment plan with patient and/or surrogate as well as nursing, discussions with consultants, evaluation  of patient's response to treatment, examination of patient, obtaining history from patient or surrogate, ordering and performing treatments and interventions, ordering and review of laboratory studies, ordering and review of radiographic studies, pulse oximetry and re-evaluation of patient's condition.   Medications Ordered in ED Medications  oxyCODONE-acetaminophen (PERCOCET/ROXICET) 5-325 MG per tablet 1 tablet (1 tablet Oral Given 05/22/18 1822)     Initial Impression / Assessment and Plan / ED Course  I have reviewed the triage vital signs and the nursing notes.  Pertinent labs & imaging results that were available during my care of the patient were reviewed by me and considered in my medical decision making (see chart for details).     Patient with progressive symptoms over the past month now consistent with congestive heart failure exacerbation.  Bilateral pleural effusions.  Elevated BNP.  Bilateral lower extremity edema.  Doubt PE.  Doubt ACS.  EKG  sinus rhythm.  Troponin negative.  IV Lasix now.  Given patient's new acute hypoxia and oxygen requirement she will be admitted to hospital for additional work-up and diuresis.  Final Clinical Impressions(s) / ED Diagnoses   Final diagnoses:  Acute on chronic congestive heart failure, unspecified heart failure type [facility])  Hypoxia    ED Discharge Orders    None       Azalia Bilis, MD 05/23/18 [phone]

## 2018-05-22 NOTE — ED Notes (Signed)
Pt also has bil leg swelling for several weeks after started taking Lyrica for back pain.  Pt st's she stopped taking the Lyrica but continues to have swelling

## 2018-05-22 NOTE — ED Triage Notes (Signed)
Pt and pt's family st's pt has been having back problems and [facility]  Pt has appt. With Dr. Venetia MaxonStern ref. Back problems.  Pt has been falling at home due to legs being weak and painful.

## 2018-05-22 NOTE — H&P (Signed)
Kim Wagner UYE:[id number] DOB: [birth year] DOA: 05/22/2018     PCP: Myrlene Broker, MD   Outpatient Specialists:     Surgery Dr. Vertell Limber Patient arrived to ER on 05/22/18 at 1556  Patient coming from: home Lives alone,   Chief Complaint:  Chief Complaint  Patient presents with  . Fall    HPI: Kim Wagner is a 62 y.o. female with medical history significant of chronic back pain    Presented with difficult weakness could not get up from the swing today while swinging her grandson at the park.  Grandchild alerted the parent and patient was brought to emergency department she was noted to be hypoxic down to 76% requiring 2 L nasal canula  She has been having progressive lower extremity edema and shortness of breath has been falling at home her legs are too weak to hold her up. Take Lyrica for her back pain but did not seem to help she is stopped at both legs continue to swell She endorses weight loss initially weight gain.  She has been falling frequently with multiple bruises over her upper and lower extremities.  Patient has been having severe hip pain MRI of the back in May showed DDD patient is to follow-up with Dr. Vertell Limber.  She denies drinking alcohol but has been smoking heavily.  Has been having chronic cough denies hemoptysis.  Endorses feeling sad or blue with decreased energy and desire to participate in activities.  Endorses decreased appetite  Patient's family endorses confusion she at times unable to answer where she is at have had frequent episodes of repeating herself  Regarding pertinent Chronic problems: She never had colonoscopy mammogram has been done more then 10 years ago     While in ER:  The following Work up has been ordered so far:  Orders Placed This Encounter  Procedures  . Critical Care  . DG Chest 2 View  . CBC  . Comprehensive metabolic panel  . Troponin I  . Brain natriuretic peptide  . Consult for [facility] Admission  . EKG  12-Lead  . EKG 12-Lead  . Saline lock IV      Following Medications were ordered in ER: Medications  oxyCODONE-acetaminophen (PERCOCET/ROXICET) 5-325 MG per tablet 1 tablet (1 tablet Oral Given 05/22/18 1822)  furosemide (LASIX) injection 60 mg (60 mg Intravenous Given 05/22/18 2010)    Significant initial  Findings: Abnormal Labs Reviewed  CBC - Abnormal; Notable for the following components:      Result Value   Hemoglobin 9.3 (*)    HCT 31.7 (*)    MCV 76.0 (*)    MCH 22.3 (*)    MCHC 29.3 (*)    RDW 20.7 (*)    All other components within normal limits  COMPREHENSIVE METABOLIC PANEL - Abnormal; Notable for the following components:   Sodium 127 (*)    Chloride 87 (*)    Calcium 8.6 (*)    Total Protein 5.4 (*)    Albumin 3.0 (*)    AST 14 (*)    Alkaline Phosphatase 159 (*)    All other components within normal limits  BRAIN NATRIURETIC PEPTIDE - Abnormal; Notable for the following components:   B Natriuretic Peptide 353.5 (*)    All other components within normal limits     Na 127 K 3.8  Cr   down from baseline see below Lab Results  Component Value Date   CREATININE 0.46 05/22/2018   CREATININE 0.73 05/31/2013  CREATININE 0.95 04/18/2013     Trop < 0.03 WBC  10.4  HG/HCT   Down      Component Value Date/Time   HGB 9.3 (L) 05/22/2018 1800   HCT 31.7 (L) 05/22/2018 1800     BNP (last 3 results) Recent Labs    05/22/18 1800  BNP 353.5*    ProBNP (last 3 results) No results for input(s): PROBNP in the last 8760 hours.  Lactic Acid, Venous No results found for: LATICACIDVEN    UA not ordered   CXR -bilateral pleural effusions associated atelectasis/consolidation      ECG:  Personally reviewed by me showing: HR : 82 Rhythm:  NSR    no evidence of ischemic changes QTC 445     ED Triage Vitals  Enc Vitals Group     BP 05/22/18 1626 125/76     Pulse Rate 05/22/18 1626 97     Resp 05/22/18 1626 14     Temp --      Temp src --       SpO2 05/22/18 1626 99 %     Weight 05/22/18 1627 135 lb (61.2 kg)     Height 05/22/18 1627 '5\' 4"'  (1.626 m)     Head Circumference --      Peak Flow --      Pain Score 05/22/18 1627 10     Pain Loc --      Pain Edu? --      Excl. in [place]? --   TMAX(24)@       Latest  Blood pressure 129/70, pulse 79, resp. rate (!) 23, height '5\' 4"'  (1.626 m), weight 61.2 kg, SpO2 98 %.     Hospitalist was called for admission for diagnosis of CHF with exacerbation fatigue,  Review of Systems:    Pertinent positives include:  anasarca, dizziness,  Bilateral lower extremity swelling  Confusion excess mucus shortness of breath at rest. dyspnea on exertion, productive cough Constitutional:  No weight loss, night sweats, Fevers, chills, weight loss  HEENT:  No headaches, Difficulty swallowing,Tooth/dental problems,Sore throat,  No sneezing, itching, ear ache, nasal congestion, post nasal drip,  Cardio-vascular:  No chest pain, Orthopnea, PNDpalpitations.no GI:  No heartburn, indigestion, abdominal pain, nausea, vomiting, diarrhea, change in bowel habits, loss of appetite, melena, blood in stool, hematemesis Resp:   No coughing up of blood.No change in color of mucus.No wheezing. Skin:  no rash or lesions. No jaundice GU:  no dysuria, change in color of urine, no urgency or frequency. No straining to urinate.  No flank pain.  Musculoskeletal:  No joint pain or no joint swelling. No decreased range of motion. No back pain.  Psych:  No change in mood or affect. No depression or anxiety. No memory loss.  Neuro: no localizing neurological complaints, no tingling, no weakness, no double vision, no gait abnormality, no slurred speech, no  All systems reviewed and apart from [place] all are negative  Past Medical History:   Past Medical History:  Diagnosis Date  . Chest discomfort    ONLY "IF RUNNING TO PLAY WITH GRANDCHILDREN" - PT IS A SMOKER AND HAS STRONG FAMILY HX OF HEART PROBLEMS AND WILL HAVE  NUCLEAR STRESS TEST TUESDAY 06/01/13 FOR CLEARANCE FOR PLANNED URETEROSCOPY, LASER LITHO SURGERY ON 9/15.  Marland Kitchen History of kidney stones       Past Surgical History:  Procedure Laterality Date  . ABDOMINAL HYSTERECTOMY    . CYSTOSCOPY W/ RETROGRADES Left 04/18/2013   Procedure: CYSTOSCOPY  WITH LEFT RETROGRADE PYELOGRAM; URETER STENT PLACEMENT;  Surgeon: Molli Hazard, MD;  Location: [facility];  Service: Urology;  Laterality: Left;  . CYSTOSCOPY WITH RETROGRADE PYELOGRAM, URETEROSCOPY AND STENT PLACEMENT Left 06/07/2013   Procedure: CYSTOSCOPY, BILATERAL RETROGRADE PYELOGRAM, LEFT URETEROSCOPY, LEFT LASER LITHOTRIPSY, LEFT URETER STENT EXCHANGE;  Surgeon: Molli Hazard, MD;  Location: [facility];  Service: Urology;  Laterality: Left;  CYSTOSCOPY, BILATERAL RETROGRADE PYELOGRAM, LEFT URETEROSCOPY, LEFT LASER LITHOTRIPSY, LEFT URETER STENT EXCHANGE   . HOLMIUM LASER APPLICATION Left 2/99/3716   Procedure: HOLMIUM LASER APPLICATION;  Surgeon: Molli Hazard, MD;  Location: [facility];  Service: Urology;  Laterality: Left;  . right ankle surgery    . SHOULDER SURGERY Left     Social History:  Ambulatory  walker      reports that she has been smoking cigarettes. She has a 40.00 pack-year smoking history. She has never used smokeless tobacco. She reports that she does not drink alcohol or use drugs.     Family History:   Family History  Problem Relation Age of Onset  . Stroke Unknown   . CAD Mother 28  . CAD Brother 50  . CAD Brother 51    Allergies: Allergies  Allergen Reactions  . Penicillins Rash    Has patient had a PCN reaction causing immediate rash, facial/tongue/throat swelling, SOB or lightheadedness with hypotension: Yes Has patient had a PCN reaction causing severe rash involving mucus membranes or skin necrosis: Unk Has patient had a PCN reaction that required hospitalization: No Has patient had a PCN reaction occurring within the last 10 years: No If all of the  above answers are "NO", then may proceed with Cephalosporin use.      Prior to Admission medications   Medication Sig Start Date End Date Taking? Authorizing Provider  aspirin EC 81 MG tablet Take 81 mg by mouth at bedtime.   Yes [provider]  gabapentin (NEURONTIN) 300 MG capsule Take 300 mg by mouth at bedtime as needed (for nerve pain).  03/12/18  Yes [provider]  Multiple Vitamins-Minerals (CENTRUM WOMEN) TABS Take 1 tablet by mouth daily.   Yes [provider]  ciprofloxacin (CIPRO) 500 MG tablet Take 1 tablet (500 mg total) by mouth 2 (two) times daily. Patient not taking: Reported on 05/22/2018 06/07/13   Rolan Bucco, MD  oxyCODONE-acetaminophen (PERCOCET) 5-325 MG per tablet Take 1-2 tablets by mouth every 4 (four) hours as needed for pain. Patient not taking: Reported on 05/22/2018 06/07/13   Rolan Bucco, MD  senna-docusate [facility]) 8.6-50 MG per tablet Take 1 tablet by mouth 2 (two) times daily. Patient not taking: Reported on 05/22/2018 06/07/13   Rolan Bucco, MD   Physical Exam: Blood pressure 129/70, pulse 79, resp. rate (!) 23, height '5\' 4"'  (1.626 m), weight 61.2 kg, SpO2 98 %. 1. General:  in No Acute distress   Chronically ill  -appearing 2. Psychological: Alert and   Oriented 3. Head/ENT:   Moist   Mucous Membranes                          Head Non traumatic, neck supple                           Poor Dentition 4. SKIN: normal  Skin turgor,  Skin clean Dry and intact no rash 5. Heart: Regular rate and rhythm no  Murmur, no Rub  or gallop 6. Lungs:  no wheezes some  crackles bilaterally 7. Abdomen: Soft,  non-tender, Non distended  bowel sounds present 8. Lower extremities: no clubbing, cyanosis,  4+edema up to the thighs 9. Neurologically Grossly intact, moving all 4 extremities equally   10. MSK: Normal range of motion   LABS:     Recent Labs  Lab 05/22/18 1800  WBC 10.4  HGB 9.3*  HCT 31.7*  MCV 76.0*  PLT 358     Basic Metabolic Panel: Recent Labs  Lab 05/22/18 1800  NA 127*  K 3.8  CL 87*  CO2 31  GLUCOSE 95  BUN 9  CREATININE 0.46  CALCIUM 8.6*      Recent Labs  Lab 05/22/18 1800  AST 14*  ALT 11  ALKPHOS 159*  BILITOT 0.6  PROT 5.4*  ALBUMIN 3.0*   No results for input(s): LIPASE, AMYLASE in the last 168 hours. No results for input(s): AMMONIA in the last 168 hours.    HbA1C: No results for input(s): HGBA1C in the last 72 hours. CBG: No results for input(s): GLUCAP in the last 168 hours.    Urine analysis:    Component Value Date/Time   COLORURINE RED (A) 04/18/2013 1855   APPEARANCEUR TURBID (A) 04/18/2013 1855   LABSPEC 1.023 04/18/2013 1855   PHURINE 5.5 04/18/2013 1855   GLUCOSEU NEGATIVE 04/18/2013 1855   HGBUR LARGE (A) 04/18/2013 1855   BILIRUBINUR SMALL (A) 04/18/2013 1855   KETONESUR 15 (A) 04/18/2013 1855   PROTEINUR 100 (A) 04/18/2013 1855   UROBILINOGEN 1.0 04/18/2013 1855   NITRITE POSITIVE (A) 04/18/2013 1855   LEUKOCYTESUR MODERATE (A) 04/18/2013 1855       Cultures:    Component Value Date/Time   SDES URINE, CLEAN CATCH 04/18/2013 1855   SPECREQUEST NONE 04/18/2013 1855   CULT ESCHERICHIA COLI 04/18/2013 1855   REPTSTATUS 04/22/2013 FINAL 04/18/2013 1855     Radiological Exams on Admission: Dg Chest 2 View  Result Date: 05/22/2018 CLINICAL DATA:  62 year old female with a history of weakness and shortness of breath EXAM: CHEST - 2 VIEW COMPARISON:  01/02/2018 FINDINGS: Cardiomediastinal silhouette unchanged in size and contour. No evidence of central vascular congestion. Patchy opacities at the lung bases with associated small pleural effusions. Chronic coarsening of interstitial markings. No pneumothorax. Thoracic spine degenerative changes.  No acute displaced fracture. IMPRESSION: Bilateral small pleural effusions with associated atelectasis/consolidation. If there is concern for pneumonia, recommend correlation with lab values.  Multilevel degenerative changes of the spine. Electronically Signed   By: Corrie Mckusick D.O.   On: 05/22/2018 17:27    Chart has been reviewed    Assessment/Plan  62 y.o. female with medical history significant of chronic back pain Admitted for presumed new diagnosis of CHF  Present on Admission: . Leg edema will obtain echogram to evaluate for CHF given elevated BNP.  Also noted to have low albumin could contribute to anasarca.  Will check UA.  Will check prealbumin . Dyspnea obtain CT of the chest to further qualify patient has extensive changes on chest x-ray to suggest COPD Will need further cardiac and pulmonary work-up to evaluate because of dyspnea . Back pain likely secondary to degenerative disc disease will need to follow-up with Dr. Vertell Limber . Anemia denies melena or blood in stools will order Hemoccult stool evidence of iron deficiency anemia.  Will need GI work-up . Hyponatremia in the setting of fluid overload we will diurese and follow sodium . Fluid overload will diuresis and  evaluate cardiac function with echogram if evidence of CHF will need cardiac consult  Significant weight loss with elevated alk phos -will evaluate for possible malignancy CT chest and abdomen could also obstruct evaluate for any evidence of obstruction causing lymphedema bilaterally  Repeated falls with memory problems and confusion.  Will obtain CT of the head to evaluate for any intracranial bleed check an ammonia level and VBG  Other plan as per orders.  DVT prophylaxis:  SCD   Code Status:    DNR/DNI   as per patient  I had personally discussed CODE STATUS with patient      Family Communication:   Family  at  Bedside  plan of care was discussed with   GrandSon, Daughter,    Disposition Plan:   likely will need placement for rehabilitation                                           Would benefit from PT/OT eval prior to DC  Ordered                   Swallow eval - SLP ordered                    Social Work  consulted                   Nutrition    consulted                  Consults called: Please consult cardiology if evidence of CHF  Admission status:    inpatient     Expect 2 midnight stay secondary to severity of patient's current illness    I expect  patient will continue meet inpatient criteria for next 2 midnights despite optimal medical management. Patient is at high risk for adverse outcome (such as loss of life or disability) if not treated.     Level of care    tele             Toy Baker 05/23/2018, 1:20 AM    [facility]  Pager [phone]   after 2 AM please page floor coverage PA If 7AM-7PM, please contact the day team taking care of the patient  [email or web address]  Password [email or web address]

## 2018-05-22 NOTE — ED Triage Notes (Signed)
Fell at home, hx of falls per family.

## 2018-05-23 ENCOUNTER — Other Ambulatory Visit: Payer: Self-pay

## 2018-05-23 ENCOUNTER — Inpatient Hospital Stay (HOSPITAL_COMMUNITY): Payer: 59

## 2018-05-23 ENCOUNTER — Encounter (HOSPITAL_COMMUNITY): Payer: Self-pay | Admitting: Emergency Medicine

## 2018-05-23 DIAGNOSIS — W19XXXA Unspecified fall, initial encounter: Secondary | ICD-10-CM | POA: Diagnosis present

## 2018-05-23 DIAGNOSIS — J9601 Acute respiratory failure with hypoxia: Secondary | ICD-10-CM | POA: Diagnosis present

## 2018-05-23 DIAGNOSIS — K4190 Unilateral femoral hernia, without obstruction or gangrene, not specified as recurrent: Secondary | ICD-10-CM | POA: Diagnosis present

## 2018-05-23 DIAGNOSIS — Z716 Tobacco abuse counseling: Secondary | ICD-10-CM | POA: Diagnosis not present

## 2018-05-23 DIAGNOSIS — E871 Hypo-osmolality and hyponatremia: Secondary | ICD-10-CM | POA: Diagnosis present

## 2018-05-23 DIAGNOSIS — N29 Other disorders of kidney and ureter in diseases classified elsewhere: Secondary | ICD-10-CM | POA: Diagnosis present

## 2018-05-23 DIAGNOSIS — R410 Disorientation, unspecified: Secondary | ICD-10-CM | POA: Diagnosis present

## 2018-05-23 DIAGNOSIS — D509 Iron deficiency anemia, unspecified: Secondary | ICD-10-CM | POA: Diagnosis present

## 2018-05-23 DIAGNOSIS — R634 Abnormal weight loss: Secondary | ICD-10-CM | POA: Diagnosis present

## 2018-05-23 DIAGNOSIS — Z8249 Family history of ischemic heart disease and other diseases of the circulatory system: Secondary | ICD-10-CM | POA: Diagnosis not present

## 2018-05-23 DIAGNOSIS — R296 Repeated falls: Secondary | ICD-10-CM | POA: Diagnosis present

## 2018-05-23 DIAGNOSIS — J9811 Atelectasis: Secondary | ICD-10-CM | POA: Diagnosis present

## 2018-05-23 DIAGNOSIS — Z6823 Body mass index (BMI) 23.0-23.9, adult: Secondary | ICD-10-CM | POA: Diagnosis not present

## 2018-05-23 DIAGNOSIS — Q615 Medullary cystic kidney: Secondary | ICD-10-CM | POA: Diagnosis not present

## 2018-05-23 DIAGNOSIS — M549 Dorsalgia, unspecified: Secondary | ICD-10-CM | POA: Diagnosis present

## 2018-05-23 DIAGNOSIS — R0902 Hypoxemia: Secondary | ICD-10-CM | POA: Diagnosis present

## 2018-05-23 DIAGNOSIS — K5730 Diverticulosis of large intestine without perforation or abscess without bleeding: Secondary | ICD-10-CM | POA: Diagnosis present

## 2018-05-23 DIAGNOSIS — Z87442 Personal history of urinary calculi: Secondary | ICD-10-CM | POA: Diagnosis not present

## 2018-05-23 DIAGNOSIS — Y92009 Unspecified place in unspecified non-institutional (private) residence as the place of occurrence of the external cause: Secondary | ICD-10-CM | POA: Diagnosis not present

## 2018-05-23 DIAGNOSIS — Z7982 Long term (current) use of aspirin: Secondary | ICD-10-CM | POA: Diagnosis not present

## 2018-05-23 DIAGNOSIS — E8770 Fluid overload, unspecified: Secondary | ICD-10-CM | POA: Diagnosis present

## 2018-05-23 DIAGNOSIS — F17210 Nicotine dependence, cigarettes, uncomplicated: Secondary | ICD-10-CM | POA: Diagnosis present

## 2018-05-23 DIAGNOSIS — I110 Hypertensive heart disease with heart failure: Secondary | ICD-10-CM | POA: Diagnosis present

## 2018-05-23 DIAGNOSIS — N1330 Unspecified hydronephrosis: Secondary | ICD-10-CM | POA: Diagnosis present

## 2018-05-23 DIAGNOSIS — I5033 Acute on chronic diastolic (congestive) heart failure: Secondary | ICD-10-CM | POA: Diagnosis present

## 2018-05-23 DIAGNOSIS — Z90710 Acquired absence of both cervix and uterus: Secondary | ICD-10-CM | POA: Diagnosis not present

## 2018-05-23 DIAGNOSIS — Z880 Allergy status to penicillin: Secondary | ICD-10-CM | POA: Diagnosis not present

## 2018-05-23 LAB — COMPREHENSIVE METABOLIC PANEL
ALBUMIN: 2.9 g/dL — AB (ref 3.5–5.0)
ALK PHOS: 158 U/L — AB (ref 38–126)
ALT: 12 U/L (ref 0–44)
ANION GAP: 8 (ref 5–15)
AST: 15 U/L (ref 15–41)
BUN: <5 mg/dL — ABNORMAL LOW (ref 8–23)
CALCIUM: 8.8 mg/dL — AB (ref 8.9–10.3)
CO2: 37 mmol/L — ABNORMAL HIGH (ref 22–32)
Chloride: 85 mmol/L — ABNORMAL LOW (ref 98–111)
Creatinine, Ser: 0.45 mg/dL (ref 0.44–1.00)
GFR calc non Af Amer: >60 mL/min (ref >60)
GLUCOSE: 100 mg/dL — AB (ref 70–99)
POTASSIUM: 3.5 mmol/L (ref 3.5–5.1)
SODIUM: 130 mmol/L — AB (ref 135–145)
Total Bilirubin: 0.7 mg/dL (ref 0.3–1.2)
Total Protein: 5.7 g/dL — ABNORMAL LOW (ref 6.5–8.1)

## 2018-05-23 LAB — CBC WITH DIFFERENTIAL/PLATELET
ABS IMMATURE GRANULOCYTES: 0 10*3/uL (ref 0.0–0.1)
BASOS PCT: 1 %
Basophils Absolute: 0 10*3/uL (ref 0.0–0.1)
EOS PCT: 2 %
Eosinophils Absolute: 0.1 10*3/uL (ref 0.0–0.7)
HCT: 32.7 % — ABNORMAL LOW (ref 36.0–46.0)
Hemoglobin: 9.5 g/dL — ABNORMAL LOW (ref 12.0–15.0)
Immature Granulocytes: 0 %
Lymphocytes Relative: 22 %
Lymphs Abs: 1.8 10*3/uL (ref 0.7–4.0)
MCH: 22 pg — AB (ref 26.0–34.0)
MCHC: 29.1 g/dL — ABNORMAL LOW (ref 30.0–36.0)
MCV: 75.7 fL — AB (ref 78.0–100.0)
MONO ABS: 0.9 10*3/uL (ref 0.1–1.0)
MONOS PCT: 11 %
NEUTROS ABS: 5.4 10*3/uL (ref 1.7–7.7)
Neutrophils Relative %: 64 %
PLATELETS: 346 10*3/uL (ref 150–400)
RBC: 4.32 MIL/uL (ref 3.87–5.11)
RDW: 20.7 % — ABNORMAL HIGH (ref 11.5–15.5)
WBC: 8.3 10*3/uL (ref 4.0–10.5)

## 2018-05-23 LAB — TROPONIN I
Troponin I: <0.03 ng/mL (ref <0.03)
Troponin I: <0.03 ng/mL (ref <0.03)

## 2018-05-23 LAB — PROTIME-INR
INR: 1.04
PROTHROMBIN TIME: 13.6 s (ref 11.4–15.2)

## 2018-05-23 LAB — ETHANOL: Alcohol, Ethyl (B): <10 mg/dL (ref <10)

## 2018-05-23 LAB — PHOSPHORUS: Phosphorus: 3.7 mg/dL (ref 2.5–4.6)

## 2018-05-23 LAB — TSH: TSH: 0.846 u[IU]/mL (ref 0.350–4.500)

## 2018-05-23 LAB — AMMONIA: AMMONIA: 34 umol/L (ref 9–35)

## 2018-05-23 LAB — MAGNESIUM: Magnesium: 1.7 mg/dL (ref 1.7–2.4)

## 2018-05-23 LAB — PREALBUMIN: Prealbumin: 9.7 mg/dL — ABNORMAL LOW (ref 18–38)

## 2018-05-23 MED ORDER — VITAMIN B-1 100 MG PO TABS
100.0000 mg | ORAL_TABLET | Freq: Every day | ORAL | Status: DC
  Administered 2018-05-23 – 2018-05-27 (×5): 100 mg via ORAL
  Filled 2018-05-23 (×5): qty 1

## 2018-05-23 MED ORDER — IOPAMIDOL (ISOVUE-370) INJECTION 76%
INTRAVENOUS | Status: AC
  Filled 2018-05-23: qty 100

## 2018-05-23 MED ORDER — ONDANSETRON HCL 4 MG PO TABS: 4.0000 mg | ORAL_TABLET | Freq: Four times a day (QID) | ORAL | Status: DC | PRN

## 2018-05-23 MED ORDER — SODIUM CHLORIDE 0.9% FLUSH
3.0000 mL | Freq: Two times a day (BID) | INTRAVENOUS | Status: DC
  Administered 2018-05-23 – 2018-05-27 (×9): 3 mL via INTRAVENOUS

## 2018-05-23 MED ORDER — ENSURE ENLIVE PO LIQD
237.0000 mL | Freq: Two times a day (BID) | ORAL | Status: DC
  Administered 2018-05-23 – 2018-05-27 (×8): 237 mL via ORAL

## 2018-05-23 MED ORDER — ONDANSETRON HCL 4 MG/2ML IJ SOLN: 4.0000 mg | Freq: Four times a day (QID) | INTRAMUSCULAR | Status: DC | PRN

## 2018-05-23 MED ORDER — NICOTINE 21 MG/24HR TD PT24
21.0000 mg | MEDICATED_PATCH | Freq: Every day | TRANSDERMAL | Status: DC
  Administered 2018-05-23 – 2018-05-27 (×6): 21 mg via TRANSDERMAL
  Filled 2018-05-23 (×6): qty 1

## 2018-05-23 MED ORDER — ACETAMINOPHEN 325 MG PO TABS: 650.0000 mg | ORAL_TABLET | Freq: Four times a day (QID) | ORAL | Status: DC | PRN

## 2018-05-23 MED ORDER — RAMIPRIL 1.25 MG PO CAPS
1.2500 mg | ORAL_CAPSULE | Freq: Two times a day (BID) | ORAL | Status: DC
  Administered 2018-05-23 – 2018-05-27 (×11): 1.25 mg via ORAL
  Filled 2018-05-23 (×11): qty 1

## 2018-05-23 MED ORDER — ACETAMINOPHEN 650 MG RE SUPP: 650.0000 mg | Freq: Four times a day (QID) | RECTAL | Status: DC | PRN

## 2018-05-23 MED ORDER — SODIUM CHLORIDE 0.9 % IV SOLN
250.0000 mL | INTRAVENOUS | Status: DC | PRN
  Administered 2018-05-25: 250 mL via INTRAVENOUS

## 2018-05-23 MED ORDER — HYDROCODONE-ACETAMINOPHEN 5-325 MG PO TABS
1.0000 | ORAL_TABLET | ORAL | Status: DC | PRN
  Administered 2018-05-23 – 2018-05-24 (×2): 1 via ORAL
  Administered 2018-05-24: 2 via ORAL
  Administered 2018-05-25 (×2): 1 via ORAL
  Administered 2018-05-26 – 2018-05-27 (×4): 2 via ORAL
  Filled 2018-05-23 (×3): qty 1
  Filled 2018-05-23 (×5): qty 2
  Filled 2018-05-23: qty 1
  Filled 2018-05-23: qty 2

## 2018-05-23 MED ORDER — IOPAMIDOL (ISOVUE-300) INJECTION 61%
INTRAVENOUS | Status: AC
  Administered 2018-05-23: 100 mL
  Filled 2018-05-23: qty 100

## 2018-05-23 MED ORDER — ASPIRIN EC 81 MG PO TBEC
81.0000 mg | DELAYED_RELEASE_TABLET | Freq: Every day | ORAL | Status: DC
  Administered 2018-05-23 – 2018-05-27 (×5): 81 mg via ORAL
  Filled 2018-05-23 (×5): qty 1

## 2018-05-23 MED ORDER — ORAL CARE MOUTH RINSE
15.0000 mL | Freq: Two times a day (BID) | OROMUCOSAL | Status: DC
  Administered 2018-05-23 – 2018-05-27 (×7): 15 mL via OROMUCOSAL

## 2018-05-23 MED ORDER — SODIUM CHLORIDE 0.9% FLUSH: 3.0000 mL | INTRAVENOUS | Status: DC | PRN

## 2018-05-23 MED ORDER — FUROSEMIDE 10 MG/ML IJ SOLN
40.0000 mg | Freq: Two times a day (BID) | INTRAMUSCULAR | Status: DC
  Administered 2018-05-23 – 2018-05-25 (×5): 40 mg via INTRAVENOUS
  Filled 2018-05-23 (×5): qty 4

## 2018-05-23 NOTE — Progress Notes (Signed)
OT Cancellation Note  Patient Details Name: Enid SkeensGay Tench MRN: [id number] DOB: [birth year]   Cancelled Treatment:    Reason Eval/Treat Not Completed: Patient at procedure or test/ unavailable(CT)  Evern BioLaura J Josephine Rudnick 05/23/2018, 9:55 AM  Sherryl MangesLaura Wanetta Funderburke OTR/L Acute Rehabilitation Services Pager: [phone] Office: [phone]

## 2018-05-23 NOTE — Progress Notes (Signed)
Initial Nutrition Assessment  DOCUMENTATION CODES:  Not applicable  INTERVENTION:  [facility] to [facility] and [facility]  Provided brief, beginner diet education on HF/salt (no certain dx yet)  Continue Ensure ENlive.   NUTRITION DIAGNOSIS:  Increased nutrient needs related to acute illness (resp failure)  as evidenced by estimated nutritional requirements for this condition.   GOAL:  Patient will meet greater than or equal to 90% of their needs   MONITOR:  PO intake, Supplement acceptance, Labs, Weight trends, Diet advancement, I & O's  REASON FOR ASSESSMENT:  Consult Assessment of nutrition requirement/status  ASSESSMENT:  62 y/o female PMHx Chronic back pain, extensive tobacco abuse. Presented after could not get up off of swing set due to weakness. Has had SOB x 1 month. Workup revealed BLE edema, Bilateral pleural effusions and elevated BNP. Admitted for suspected new HF.   Pt alone on RD arrival. She reports that she has an erratic appetite at baseline. RD asked if her SOB was severe enough to impact her appetite. Like appetite, she says her level of SOB varies dramatically from day to day. She did not take any nutritional supplements at baseline-no vitamins or minerals.   RD attempted to discern if diet laden with salt/fuid.  She says she did not intentionally follow any therapeutic diet, but says she uses very little salt at baseline. Took diet recall. She rarely uses saltshaker.  She denies eating out often, eating frozen meals or several other high salt items RD names. However, from a fluid perspective. She endorses drinking ~3x 16.9 oz bottles of water/day and also 5x 6-8oz cups of coffee/day. This is >80 oz fluid. RD reviewed that she may be asked to reduce fluid intake if she does in fact have CHF.   Weight wise, she says she was 138 lbs 6 months ago. Desiring to lose weight, she cut out all of her dr. Reino KentPepper. She successfully lost weight, but then became addicted to  rice crispy treats and has begun to regain weight. At her lowest, she says she was 127 lbs. Her current listed wt (standing scale) is 138 lbs, but she also had significant BLE edema on admission. There is minimal weight history in chart, as the most recent weight taken prior to this admission was in 2014 when she was 142 lbs.   At this time, pt reports a good appetite. She denies any n/v/c/d. Her SOB is improving. She is largely under the impression her recent start of lyrica caused this fluid to develop. No echo done as of yet. Deferred giving full HF education until dx more certain.   She ate 100% of the only meal documented. She already has ensure ordered.   Labs: Na: 130, Albumin:2.9, Prealbumin:9.7 Meds: Lasix, Ensure, Thiamine  Recent Labs  Lab 05/22/18 1800 05/23/18 0827  NA 127* 130*  K 3.8 3.5  CL 87* 85*  CO2 31 37*  BUN 9 <5*  CREATININE 0.46 0.45  CALCIUM 8.6* 8.8*  MG  --  1.7  PHOS  --  3.7  GLUCOSE 95 100*   NUTRITION - FOCUSED PHYSICAL EXAM:   Most Recent Value  Orbital Region  Mild depletion  Upper Arm Region  No depletion  Thoracic and Lumbar Region  No depletion  Buccal Region  No depletion  Temple Region  No depletion  Clavicle Bone Region  Mild depletion  Clavicle and Acromion Bone Region  Mild depletion  Scapular Bone Region  No depletion  Dorsal Hand  No  depletion  Patellar Region  No depletion  Anterior Thigh Region  No depletion  Posterior Calf Region  No depletion  Edema (RD Assessment)  Mild     Diet Order:   Diet Order            Diet Heart Room service appropriate? Yes; Fluid consistency: Thin  Diet effective now             EDUCATION NEEDS:  Not appropriate for education at this time  Skin: Ecchymosis to BUE  Last BM:  8/29  Height:  Ht Readings from Last 1 Encounters:  05/22/18 5\' 4"  (1.626 m)   Weight:  Wt Readings from Last 1 Encounters:  05/23/18 62.6 kg   Wt Readings from Last 10 Encounters:  05/23/18 62.6 kg   06/01/13 64.4 kg  05/31/13 66.4 kg  05/28/13 66 kg  04/18/13 73 kg  09/17/11 78 kg   Ideal Body Weight:  54.54 kg  BMI:  Body mass index is 23.7 kg/m.  Estimated Nutritional Needs:  Kcal:  1900-2050 kcals (30-33 kcal/kg bw) Protein:  85-100g Pro (1.4-1.6 g/kg bw) Fluid:  Per MD fluid goals  Christophe Louis RD, LDN, CNSC Clinical Nutrition Available Tues-Sat via Pager: [phone] 05/23/2018 5:06 PM

## 2018-05-23 NOTE — ED Notes (Signed)
Dr. Doutova at bedside.  

## 2018-05-23 NOTE — Evaluation (Signed)
Occupational Therapy Evaluation Patient Details Name: Kim Wagner MRN: [id number] DOB: [birth year] Today's Date: 05/23/2018    History of Present Illness Kim Wagner is a 62 y.o. female with medical history significant of chronic back pain, tobacco abuse. Presented to ED with weakness noted to be hypoxic down to 76% requiring 2 L nasal canula. She has been having progressive lower extremity edema and shortness of breath has been falling at home her legs are too weak to hold her up. MRI of the back in May showed DDD   Clinical Impression   PTA Pt mod I with RW for ADL/IADL, lives alone - just retired after working for [employer] for 35 years in [place]. Pt is currently min guard assist for transfers, and generally min guard for ADL at this time. Pt presents with decreased activity tolerance, decreased balance, and generalized weakness. OT will follow acutely to provide energy conservation education (please take handout next session) and to maximize safety and independence in ADL and functional transfers.    Of Note: On RA, Pt's O2 saturations dropped to 80%, on 2L returned to >92%. Also Pt with VERY frequent urination (typically uses depends at home)   Follow Up Recommendations  No OT follow up;Supervision - Intermittent    Equipment Recommendations  Tub/shower seat    Recommendations for Other Services       Precautions / Restrictions Precautions Precautions: Fall Precaution Comments: increased falls leading up to admission Restrictions Weight Bearing Restrictions: No      Mobility Bed Mobility Overal bed mobility: Needs Assistance Bed Mobility: Supine to Sit     Supine to sit: Supervision;HOB elevated     General bed mobility comments: use of bed rail  Transfers Overall transfer level: Needs assistance Equipment used: Rolling walker (2 wheeled) Transfers: Sit to/from [employer] Sit to Stand: Min guard;Min assist Stand pivot transfers: Min guard;Min  assist       General transfer comment: min guard assist; vc for safe hand placement and steady upon reaching upright    Balance Overall balance assessment: Needs assistance Sitting-balance support: No upper extremity supported;Feet supported Sitting balance-Leahy Scale: Good Sitting balance - Comments: able to lateral lean for LB dressing   Standing balance support: Bilateral upper extremity supported Standing balance-Leahy Scale: Fair Standing balance comment: dependent on external support in standing                           ADL either performed or assessed with clinical judgement   ADL Overall ADL's : Needs assistance/impaired Eating/Feeding: Modified independent   Grooming: Min guard;Standing   Upper Body Bathing: Supervision/ safety   Lower Body Bathing: Supervison/ safety;Sitting/lateral leans   Upper Body Dressing : Set up   Lower Body Dressing: Set up;Sit to/from stand Lower Body Dressing Details (indicate cue type and reason): able to don socks EOB Toilet Transfer: Min guard;Ambulation;RW;Comfort height toilet;Grab bars Toilet Transfer Details (indicate cue type and reason): vc for safe hand placement Toileting- Clothing Manipulation and Hygiene: Supervision/safety;Sitting/lateral lean       Functional mobility during ADLs: Min guard;Rolling walker(able to navigate obstacles in room) General ADL Comments: Pt DOE with mobility required for ADL     Vision Baseline Vision/History: Wears glasses Wears Glasses: Reading only Patient Visual Report: No change from baseline Vision Assessment?: No apparent visual deficits     Perception     Praxis      Pertinent Vitals/Pain Pain Assessment: Faces Faces Pain Scale: Hurts  a little bit Pain Location: Bilateral Hips Pain Descriptors / Indicators: Constant Pain Intervention(s): Monitored during session;Repositioned     Hand Dominance Right   Extremity/Trunk Assessment Upper Extremity  Assessment Upper Extremity Assessment: Generalized weakness   Lower Extremity Assessment Lower Extremity Assessment: Defer to PT evaluation   Cervical / Trunk Assessment Cervical / Trunk Assessment: Kyphotic   Communication Communication Communication: No difficulties   Cognition Arousal/Alertness: Awake/alert Behavior During Therapy: WFL for tasks assessed/performed Overall Cognitive Status: Impaired/Different from baseline Area of Impairment: Safety/judgement                         Safety/Judgement: Decreased awareness of safety;Decreased awareness of deficits     General Comments: Educated Pt on importance of O2 saturations   General Comments  on RA, Pt was saturating around 80%, returned to >92 on 2 L with in room mobility. FREQUENCY of urination throughout session    Exercises     Shoulder Instructions      Home Living Family/patient expects to be discharged to:: Private residence Living Arrangements: Alone Available Help at Discharge: Family;Available 24 hours/day Type of Home: Apartment Home Access: Level entry(threshold)     Home Layout: One level     Bathroom Shower/Tub: Tub/shower unit;Curtain   Firefighter: Standard Bathroom Accessibility: Yes How Accessible: Accessible via walker Home Equipment: Walker - 2 wheels          Prior Functioning/Environment Level of Independence: Independent with assistive device(s)        Comments: uses a RW for mobility        OT Problem List: Decreased strength;Decreased activity tolerance;Impaired balance (sitting and/or standing);Decreased knowledge of use of DME or AE;Decreased safety awareness      OT Treatment/Interventions: Therapeutic exercise;DME and/or AE instruction;Patient/family education;Therapeutic activities;Energy conservation;Balance training    OT Goals(Current goals can be found in the care plan section) Acute Rehab OT Goals Patient Stated Goal: to enjoy retirement, breathe  better OT Goal Formulation: With patient Time For Goal Achievement: 06/06/18 Potential to Achieve Goals: Good ADL Goals Pt Will Perform Grooming: with modified independence;standing Pt Will Transfer to Toilet: with modified independence;ambulating Pt Will Perform Toileting - Clothing Manipulation and hygiene: with modified independence;sit to/from stand Additional ADL Goal #1: Pt will recall 3 ways of conserving energy during ADL/IADL at independent level  OT Frequency: Min 2X/week   Barriers to D/C:            Co-evaluation PT/OT/SLP Co-Evaluation/Treatment: Yes Reason for Co-Treatment: For patient/therapist safety;To address functional/ADL transfers PT goals addressed during session: Mobility/safety with mobility;Balance;Proper use of DME OT goals addressed during session: ADL's and self-care;Proper use of Adaptive equipment and DME      AM-PAC PT "6 Clicks" Daily Activity     Outcome Measure Help from another person eating meals?: None Help from another person taking care of personal grooming?: A Little Help from another person toileting, which includes using toliet, bedpan, or urinal?: A Little Help from another person bathing (including washing, rinsing, drying)?: A Little Help from another person to put on and taking off regular upper body clothing?: None Help from another person to put on and taking off regular lower body clothing?: A Little 6 Click Score: 20   End of Session Equipment Utilized During Treatment: Gait belt;Rolling walker;Oxygen(2L) Nurse Communication: Mobility status  Activity Tolerance: Patient tolerated treatment well Patient left: in chair;with call bell/phone within reach;with chair alarm set  OT Visit Diagnosis: Unsteadiness on feet (R26.81);Muscle weakness (  generalized) (M62.81)                Time: [id number] OT Time Calculation (min): 26 min Charges:  OT General Charges $OT Visit: 1 Visit OT Evaluation $OT Eval Moderate Complexity: 1  Mod  Sherryl MangesLaura Raquel Sayres OTR/L Acute Rehabilitation Services Pager: [phone] Office: [phone]  Evern BioLaura J Aurielle Slingerland 05/23/2018, 11:16 AM

## 2018-05-23 NOTE — Progress Notes (Signed)
PT Cancellation Note  Patient Details Name: Enid SkeensGay Strothman MRN: [id number] DOB: [birth year]   Cancelled Treatment:    Reason Eval/Treat Not Completed: Patient at procedure or test/unavailable. Will follow-up for PT evaluation as schedule permits.  Ina HomesJaclyn Woodfin Kiss, PT, DPT Acute Rehabilitation Services  Pager [phone] Office [phone]  Malachy ChamberJaclyn L Thaxton Pelley 05/23/2018, 9:54 AM

## 2018-05-23 NOTE — Progress Notes (Signed)
PROGRESS NOTE    Kim Wagner  ZOX:[id number] DOB: Oct 15, 1955 DOA: 05/22/2018 PCP: Hadley Pen, MD    Brief Narrative:  Kim Wagner is a 62 y.o. female with medical history significant of chronic back pain, has been having progressive lower extremity edema and shortness of breath has been falling at home . Patient has been having severe hip pain MRI of the back in May showed DDD patient is to follow-up with Dr. Venetia Maxon. She was admitted for hypoxia with sats in 70's, has an extensive smoking history.  She was admitted for evaluation of CHF.   Assessment & Plan:   Active Problems:   Dyspnea   Leg edema   Back pain   Anemia   Hyponatremia   Fluid overload   Dyspnea, hypoxia, leg edema, elevated BNP, fluid overload with hyponatremia.  Admitted for evaluation of CHF.  CT chest does not show any acute abnormality. Echocardiogram ordered.  [place] oxygen to keep sats greater than 90%.  Serial troponins negative.  Started on IV lasix 40 mg BID.  Strict intake and output.  Daily weights.      Hyponatremia: from fluid overload. Improving.     Iron deficiency anemia:  Will need iron supplementation on discharge.  Hemoglobin stable at 9.  CT abd and pelvis does not show any acute abnormality.        DVT prophylaxis: lovenox.  Code Status: DNR Family Communication:none at bedside.  Disposition Plan: pending further evaluation.    Consultants:   None.    Procedures: echocardiogram.    Antimicrobials:none.      Subjective: No chest pain , breathing has improved from yesterday.   Objective: Vitals:   05/23/18 0920 05/23/18 0921 05/23/18 1233 05/23/18 1344  BP: 119/69  (!) 148/80 110/73  Pulse: 72  75 74  Resp: 18 18 18 18   Temp:    98.1 F (36.7 C)  TempSrc:   Oral Oral  SpO2: (!) 88% 100% 100% 100%  Weight:      Height:        Intake/Output Summary (Last 24 hours) at 05/23/2018 1623 Last data filed at 05/23/2018 1300 Gross per 24 hour  Intake 240  ml  Output 2700 ml  Net -2460 ml   Filed Weights   05/22/18 1627 05/23/18 0201  Weight: 61.2 kg 62.6 kg    Examination:  General exam: Appears calm and comfortable  Respiratory system: Clear to auscultation. Respiratory effort normal. Cardiovascular system: S1 & S2 heard, RRR. No JVD, murmurs, Gastrointestinal system: Abdomen is nondistended, soft and nontender. No organomegaly or masses felt. Normal bowel sounds heard. Central nervous system: Alert and oriented. No focal neurological deficits. Extremities: pedal edema 1+.  Skin: No rashes, lesions or ulcers Psychiatry:  Mood & affect appropriate.     Data Reviewed: I have personally reviewed following labs and imaging studies  CBC: Recent Labs  Lab 05/22/18 1800 05/22/18 2137 05/23/18 0827  WBC 10.4 10.7* 8.3  NEUTROABS  --  7.0 5.4  HGB 9.3* 9.7* 9.5*  HCT 31.7* 33.3* 32.7*  MCV 76.0* 76.2* 75.7*  PLT 358 363 346   Basic Metabolic Panel: Recent Labs  Lab 05/22/18 1800 05/23/18 0827  NA 127* 130*  K 3.8 3.5  CL 87* 85*  CO2 31 37*  GLUCOSE 95 100*  BUN 9 <5*  CREATININE 0.46 0.45  CALCIUM 8.6* 8.8*  MG  --  1.7  PHOS  --  3.7   GFR: Estimated Creatinine Clearance: 61.3 mL/min (by C-G  formula based on SCr of 0.45 mg/dL). Liver Function Tests: Recent Labs  Lab 05/22/18 1800 05/23/18 0827  AST 14* 15  ALT 11 12  ALKPHOS 159* 158*  BILITOT 0.6 0.7  PROT 5.4* 5.7*  ALBUMIN 3.0* 2.9*   No results for input(s): LIPASE, AMYLASE in the last 168 hours. Recent Labs  Lab 05/23/18 0315  AMMONIA 34   Coagulation Profile: Recent Labs  Lab 05/23/18 0315  INR 1.04   Cardiac Enzymes: Recent Labs  Lab 05/22/18 1800 05/22/18 2137 05/23/18 0315 05/23/18 0827  TROPONINI <0.03 <0.03 <0.03 <0.03   BNP (last 3 results) No results for input(s): PROBNP in the last 8760 hours. HbA1C: No results for input(s): HGBA1C in the last 72 hours. CBG: No results for input(s): GLUCAP in the last 168  hours. Lipid Profile: No results for input(s): CHOL, HDL, LDLCALC, TRIG, CHOLHDL, LDLDIRECT in the last 72 hours. Thyroid Function Tests: Recent Labs    05/23/18 0315  TSH 0.846   Anemia Panel: Recent Labs    05/22/18 2137  VITAMINB12 195  FOLATE 26.0  FERRITIN 9*  TIBC 465*  IRON 17*  RETICCTPCT 1.5   Sepsis Labs: No results for input(s): PROCALCITON, LATICACIDVEN in the last 168 hours.  No results found for this or any previous visit (from the past 240 hour(s)).       Radiology Studies: Dg Chest 2 View  Result Date: 05/22/2018 CLINICAL DATA:  62 year old female with a history of weakness and shortness of breath EXAM: CHEST - 2 VIEW COMPARISON:  01/02/2018 FINDINGS: Cardiomediastinal silhouette unchanged in size and contour. No evidence of central vascular congestion. Patchy opacities at the lung bases with associated small pleural effusions. Chronic coarsening of interstitial markings. No pneumothorax. Thoracic spine degenerative changes.  No acute displaced fracture. IMPRESSION: Bilateral small pleural effusions with associated atelectasis/consolidation. If there is concern for pneumonia, recommend correlation with lab values. Multilevel degenerative changes of the spine. Electronically Signed   By: Gilmer Mor D.O.   On: 05/22/2018 17:27   Ct Head Wo Contrast  Result Date: 05/23/2018 CLINICAL DATA:  Patient with weakness. EXAM: CT HEAD WITHOUT CONTRAST TECHNIQUE: Contiguous axial images were obtained from the base of the skull through the vertex without intravenous contrast. COMPARISON:  Brain CT 12/06/2017 FINDINGS: Brain: Ventricles and sulci are appropriate for patient's age. No evidence for acute cortically based infarct, intracranial hemorrhage, mass lesion or mass-effect. Periventricular and subcortical white matter hypodensity compatible with chronic microvascular ischemic changes. Vascular: Internal carotid arterial vascular calcifications. Skull: Intact.  Sinuses/Orbits: Paranasal sinuses are well aerated. Mastoid air cells unremarkable. Orbits are unremarkable. Other: None. IMPRESSION: No acute intracranial process. Atrophy and chronic microvascular ischemic changes. Electronically Signed   By: Annia Belt M.D.   On: 05/23/2018 01:45   Ct Chest W Contrast  Result Date: 05/23/2018 CLINICAL DATA:  62 year old female with significant unintentional weight loss and decreased appetite EXAM: CT CHEST, ABDOMEN, AND PELVIS WITH CONTRAST TECHNIQUE: Multidetector CT imaging of the chest, abdomen and pelvis was performed following the standard protocol during bolus administration of intravenous contrast. CONTRAST:  ISOVUE-300 IOPAMIDOL (ISOVUE-300) INJECTION 61% COMPARISON:  Prior CT scan of the chest 01/01/2018 FINDINGS: CT CHEST FINDINGS Cardiovascular: Conventional 3 vessel arch anatomy. Elongation of the aortic infundibulum and descending thoracic aorta consistent with a type 3 arch. Cardiac motion limits ability to precisely measure the ascending thoracic aorta. There has been no interval enlargement compared to the relatively recent prior imaging. The maximal diameter is no greater than 4  cm and is suspected to be less. Normal caliber main and central pulmonary arteries. No evidence of central pulmonary embolus. Calcifications present along the left anterior descending coronary artery. Mild cardiomegaly with left heart enlargement. No pericardial effusion. Mediastinum/Nodes: Unremarkable CT appearance of the thyroid gland. No suspicious mediastinal or hilar adenopathy. No soft tissue mediastinal mass. The thoracic esophagus is unremarkable. Lungs/Pleura: Moderate respiratory motion artifact. Atelectasis is present in both lower lobes. No evidence of pulmonary edema, pleural effusion or pneumothorax. No focal airspace consolidation. No suspicious pulmonary nodule or mass. Musculoskeletal: No acute fracture or aggressive appearing lytic or blastic osseous lesion.  CT ABDOMEN PELVIS FINDINGS Hepatobiliary: Normal hepatic contour and morphology. No discrete hepatic lesions. Normal appearance of the gallbladder. No intra or extrahepatic biliary ductal dilatation. Pancreas: Unremarkable. No pancreatic ductal dilatation or surrounding inflammatory changes. Spleen: Normal in size without focal abnormality. Adrenals/Urinary Tract: Unremarkable adrenal glands. Calcification of the renal pyramids in both the upper and lower pole of the left kidney. There is mild left hydroureteronephrosis. The ureter is mildly dilated to the level of the UVJ. No obstructing lesion or stone identified. On the right, there is no hydronephrosis or nephrocalcinosis. Stomach/Bowel: Colonic diverticular disease without CT evidence of active inflammation. No focal bowel wall thickening or evidence of obstruction. Vascular/Lymphatic: Atherosclerotic calcifications present throughout the abdominal aorta. No evidence of aneurysm. The SMA, celiac artery and IMA all remain patent without evidence to suggest high-grade stenosis. Reproductive: Surgical changes of prior hysterectomy. Mild pelvic floor laxity. Other: Moderately large right femoral hernia containing multiple loops of small bowel, mesentery and omental fat. Musculoskeletal: No acute fracture or aggressive appearing lytic or blastic osseous lesion. Mild levoconvex scoliosis centered at L1. exaggerated thoracic kyphosis. Limbus vertebra at L3. Mild grade 1 anterolisthesis of L4 on L5. IMPRESSION: 1. No acute abnormality within the chest abdomen or pelvis. 2. While there is mild aortic atherosclerosis, the visceral arteries all appear patent without evidence of significant stenosis or occlusion to suggest a source for chronic mesenteric ischemia. Aortic Atherosclerosis (ICD10-170.0) 3. 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 4. Mild left hydroureteronephrosis without evidence of distal ureteral stone,  stenosis or lesion. This may be secondary to reflux, or a bladder lesion in the region of the UVJ smaller than the resolution of CT. Consider correlation with urine cytology and potentially cystoscopy. 5. 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 6. Coronary artery calcifications. 7. Mild cardiomegaly. 8. Moderately large right femoral hernia containing multiple loops of small bowel, mesentery and omental fat. 9. Colonic diverticular disease without CT evidence of active inflammation. 10. Pelvic floor laxity. Electronically Signed   By: Malachy Moan M.D.   On: 05/23/2018 12:09   Ct Abdomen Pelvis W Contrast  Result Date: 05/23/2018 CLINICAL DATA:  62 year old female with significant unintentional weight loss and decreased appetite EXAM: CT CHEST, ABDOMEN, AND PELVIS WITH CONTRAST TECHNIQUE: Multidetector CT imaging of the chest, abdomen and pelvis was performed following the standard protocol during bolus administration of intravenous contrast. CONTRAST:  ISOVUE-300 IOPAMIDOL (ISOVUE-300) INJECTION 61% COMPARISON:  Prior CT scan of the chest 01/01/2018 FINDINGS: CT CHEST FINDINGS Cardiovascular: Conventional 3 vessel arch anatomy. Elongation of the aortic infundibulum and descending thoracic aorta consistent with a type 3 arch. Cardiac motion limits ability to precisely measure the ascending thoracic aorta. There has been no interval enlargement compared to the relatively recent prior imaging. The maximal diameter is no greater than 4 cm  and is suspected to be less. Normal caliber main and central pulmonary arteries. No evidence of central pulmonary embolus. Calcifications present along the left anterior descending coronary artery. Mild cardiomegaly with left heart enlargement. No pericardial effusion. Mediastinum/Nodes: Unremarkable CT appearance of the thyroid gland. No suspicious mediastinal or hilar adenopathy. No soft tissue  mediastinal mass. The thoracic esophagus is unremarkable. Lungs/Pleura: Moderate respiratory motion artifact. Atelectasis is present in both lower lobes. No evidence of pulmonary edema, pleural effusion or pneumothorax. No focal airspace consolidation. No suspicious pulmonary nodule or mass. Musculoskeletal: No acute fracture or aggressive appearing lytic or blastic osseous lesion. CT ABDOMEN PELVIS FINDINGS Hepatobiliary: Normal hepatic contour and morphology. No discrete hepatic lesions. Normal appearance of the gallbladder. No intra or extrahepatic biliary ductal dilatation. Pancreas: Unremarkable. No pancreatic ductal dilatation or surrounding inflammatory changes. Spleen: Normal in size without focal abnormality. Adrenals/Urinary Tract: Unremarkable adrenal glands. Calcification of the renal pyramids in both the upper and lower pole of the left kidney. There is mild left hydroureteronephrosis. The ureter is mildly dilated to the level of the UVJ. No obstructing lesion or stone identified. On the right, there is no hydronephrosis or nephrocalcinosis. Stomach/Bowel: Colonic diverticular disease without CT evidence of active inflammation. No focal bowel wall thickening or evidence of obstruction. Vascular/Lymphatic: Atherosclerotic calcifications present throughout the abdominal aorta. No evidence of aneurysm. The SMA, celiac artery and IMA all remain patent without evidence to suggest high-grade stenosis. Reproductive: Surgical changes of prior hysterectomy. Mild pelvic floor laxity. Other: Moderately large right femoral hernia containing multiple loops of small bowel, mesentery and omental fat. Musculoskeletal: No acute fracture or aggressive appearing lytic or blastic osseous lesion. Mild levoconvex scoliosis centered at L1. exaggerated thoracic kyphosis. Limbus vertebra at L3. Mild grade 1 anterolisthesis of L4 on L5. IMPRESSION: 1. No acute abnormality within the chest abdomen or pelvis. 2. While there is  mild aortic atherosclerosis, the visceral arteries all appear patent without evidence of significant stenosis or occlusion to suggest a source for chronic mesenteric ischemia. Aortic Atherosclerosis (ICD10-170.0) 3. 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 4. Mild left hydroureteronephrosis without evidence of distal ureteral stone, stenosis or lesion. This may be secondary to reflux, or a bladder lesion in the region of the UVJ smaller than the resolution of CT. Consider correlation with urine cytology and potentially cystoscopy. 5. 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 6. Coronary artery calcifications. 7. Mild cardiomegaly. 8. Moderately large right femoral hernia containing multiple loops of small bowel, mesentery and omental fat. 9. Colonic diverticular disease without CT evidence of active inflammation. 10. Pelvic floor laxity. Electronically Signed   By: Malachy MoanHeath  McCullough M.D.   On: 05/23/2018 12:09        Scheduled Meds: . aspirin EC  81 mg Oral Daily  . feeding supplement (ENSURE ENLIVE)  237 mL Oral BID BM  . furosemide  40 mg Intravenous BID  . mouth rinse  15 mL Mouth Rinse BID  . nicotine  21 mg Transdermal Daily  . ramipril  1.25 mg Oral BID  . sodium chloride flush  3 mL Intravenous Q12H  . thiamine  100 mg Oral Daily   Continuous Infusions: . sodium chloride       LOS: 0 days    Time spent: 35 minutes.     Kathlen ModyVijaya Tyeisha Dinan, MD [facility] Pager [phone]   If 7PM-7AM, please contact night-coverage [email or web address] Password [id number] 05/23/2018, 4:23 PM

## 2018-05-23 NOTE — Evaluation (Signed)
Clinical/Bedside Swallow Evaluation Patient Details  Name: Kim Wagner MRN: [id number] Date of Birth: [birth year]  Today's Date: 05/23/2018 Time: SLP Start Time (ACUTE ONLY): 1413 SLP Stop Time (ACUTE ONLY): 1435 SLP Time Calculation (min) (ACUTE ONLY): 22 min  Past Medical History:  Past Medical History:  Diagnosis Date  . Chest discomfort    ONLY "IF RUNNING TO PLAY WITH GRANDCHILDREN" - PT IS A SMOKER AND HAS STRONG FAMILY HX OF HEART PROBLEMS AND WILL HAVE NUCLEAR STRESS TEST TUESDAY 06/01/13 FOR CLEARANCE FOR PLANNED URETEROSCOPY, LASER LITHO SURGERY ON 9/15.  Marland Kitchen. History of kidney stones    Past Surgical History:  Past Surgical History:  Procedure Laterality Date  . ABDOMINAL HYSTERECTOMY    . CYSTOSCOPY W/ RETROGRADES Left 04/18/2013   Procedure: CYSTOSCOPY WITH LEFT RETROGRADE PYELOGRAM; URETER STENT PLACEMENT;  Surgeon: Milford Cageaniel Young Woodruff, MD;  [facility];  Service: Urology;  Laterality: Left;  . CYSTOSCOPY WITH RETROGRADE PYELOGRAM, URETEROSCOPY AND STENT PLACEMENT Left 06/07/2013   Procedure: CYSTOSCOPY, BILATERAL RETROGRADE PYELOGRAM, LEFT URETEROSCOPY, LEFT LASER LITHOTRIPSY, LEFT URETER STENT EXCHANGE;  Surgeon: Milford Cageaniel Young Woodruff, MD;  Location: [facility];  Service: Urology;  Laterality: Left;  CYSTOSCOPY, BILATERAL RETROGRADE PYELOGRAM, LEFT URETEROSCOPY, LEFT LASER LITHOTRIPSY, LEFT URETER STENT EXCHANGE   . HOLMIUM LASER APPLICATION Left 06/07/2013   Procedure: HOLMIUM LASER APPLICATION;  Surgeon: Milford Cageaniel Young Woodruff, MD;  Location: [facility];  Service: Urology;  Laterality: Left;  . right ankle surgery    . SHOULDER SURGERY Left    HPI:  62 y.o. female with medical history significant of chronic back pain Presented on 05/22/18 with weakness; she could not get up from the swing today while swinging her grandson at the park.  Grandchild alerted the parent and patient was brought to emergency department she was noted to be hypoxic down to 76% requiring 2 L nasal canula; CT  chest on 05/23/18 indicated No acute abnormality within the chest abdomen or pelvis  Assessment / Plan / Recommendation Clinical Impression   Pt exhibits a normal oropharyngeal swallow with OME WFL and consistencies including thin via varying volumes-solids without overt s/s of aspiration noted throughout BSE; Regular/thin liquid diet recommended; ST will s/o at this time; thank you for this consult. SLP Visit Diagnosis: Dysphagia, unspecified (R13.10)    Aspiration Risk  No limitations    Diet Recommendation   Regular/thin liquids  Medication Administration: Whole meds with liquid    Other  Recommendations Oral Care Recommendations: Oral care BID   Follow up Recommendations None      Frequency and Duration     Evaluation only       Prognosis Prognosis for Safe Diet Advancement: Good      Swallow Study   General Date of Onset: 05/22/18 HPI: 62 y.o. female with medical history significant of chronic back pain Type of Study: Bedside Swallow Evaluation Previous Swallow Assessment: (n/a) Diet Prior to this Study: Regular;Thin liquids Temperature Spikes Noted: No Respiratory Status: Nasal cannula History of Recent Intubation: No Behavior/Cognition: Alert;Cooperative;Pleasant mood Oral Cavity Assessment: Within Functional Limits Oral Care Completed by SLP: Other (Comment)(pt recently completed) Oral Cavity - Dentition: Adequate natural dentition;Missing dentition Vision: Functional for self-feeding Self-Feeding Abilities: Able to feed self Patient Positioning: Upright in chair Baseline Vocal Quality: Normal Volitional Cough: Strong Volitional Swallow: Able to elicit    Oral/Motor/Sensory Function Overall Oral Motor/Sensory Function: Within functional limits   Ice Chips     Thin Liquid Thin Liquid: Within functional limits Presentation: Cup;Straw    Nectar  Thick Nectar Thick Liquid: Not tested   Honey Thick Honey Thick Liquid: Not tested   Puree Puree: Within functional  limits Presentation: Self Fed   Solid     Solid: Within functional limits Presentation: Self Fed      Tressie Stalker, M.S., CCC-SLP 05/23/2018,3:29 PM

## 2018-05-23 NOTE — Progress Notes (Signed)
SATURATION QUALIFICATIONS: (This note is used to comply with regulatory documentation for home oxygen)  Patient Saturations on Room Air at Rest = 81%  Patient Saturations on Room Air while Ambulating = 80%  Patient Saturations on 2 Liters of oxygen while Ambulating = 91%  Please briefly explain why patient needs home oxygen: Pt requires supplemental oxygen to maintain SpO2 >90% while ambulating.  Ina HomesJaclyn Duilio Heritage, PT, DPT Acute Rehabilitation Services  Pager [phone] Office [phone]

## 2018-05-23 NOTE — Evaluation (Signed)
Physical Therapy Evaluation Patient Details Name: Kim Wagner MRN: [id number] DOB: May 14, 1956 Today's Date: 05/23/2018   History of Present Illness  Pt is a 62 y.o. female admitted 05/22/18 with c/o BLE weakness and SOB. Found to be hypoxic requiring 2L O2 [place]. Admitted for presumed new diagnosis of CHF. PMH includes chronic back pain, DDD, tobacco use.     Clinical Impression  Pt presents with an overall decrease in functional mobility secondary to above. PTA, pt mod indep with RW and lives alone, endorses multiple falls; plans to d/c to daughter's home with assist from family. Today, pt ambulating with RW and min guard to supervision-level.SpO2 down to 80% on RA, returning to >90% on 2L O2 [place] (see Saturation Qualifications note for details). Discussed recommendation for outpatient PT to address balance deficits (pt unsure if she would be able to get to appointments since she does not drive; if not, would benefit from HHPT). Pt would benefit from continued acute PT services to maximize functional mobility and independence prior to d/c home.      Follow Up Recommendations Outpatient PT;Supervision for mobility/OOB    Equipment Recommendations  None recommended by PT    Recommendations for Other Services       Precautions / Restrictions Precautions Precautions: Fall Precaution Comments: increased falls leading up to admission Restrictions Weight Bearing Restrictions: No      Mobility  Bed Mobility Overal bed mobility: Needs Assistance Bed Mobility: Supine to Sit     Supine to sit: Supervision;HOB elevated     General bed mobility comments: use of bed rail  Transfers Overall transfer level: Needs assistance Equipment used: Rolling walker (2 wheeled) Transfers: Sit to/from Stand Sit to Stand: Min guard;Supervision Stand pivot transfers: Min guard;Min assist       General transfer comment: Min guard progressing to supervision; cues for safe hand placement when standing from  toilet  Ambulation/Gait Ambulation/Gait assistance: Supervision Gait Distance (Feet): 30 Feet Assistive device: Rolling walker (2 wheeled) Gait Pattern/deviations: Step-through pattern;Decreased stride length;Trunk flexed Gait velocity: Decreased Gait velocity interpretation: 1.31 - 2.62 ft/sec, indicative of limited community ambulator General Gait Details: Ambulation limited to in-room due to pt needing to urinate frequently; supervision for balance  Stairs            Wheelchair Mobility    Modified Rankin (Stroke Patients Only)       Balance Overall balance assessment: Needs assistance Sitting-balance support: No upper extremity supported;Feet supported Sitting balance-Leahy Scale: Good Sitting balance - Comments: able to lateral lean for LB dressing   Standing balance support: Bilateral upper extremity supported Standing balance-Leahy Scale: Fair Standing balance comment: Able to static stand with no UE support                             Pertinent Vitals/Pain Pain Assessment: Faces Faces Pain Scale: Hurts a little bit Pain Location: Bilateral Hips Pain Descriptors / Indicators: Constant Pain Intervention(s): Monitored during session    Home Living Family/patient expects to be discharged to:: Private residence Living Arrangements: Alone Available Help at Discharge: Family;Available 24 hours/day Type of Home: Apartment Home Access: Level entry(threshold)     Home Layout: One level Home Equipment: Walker - 2 wheels Additional Comments: Plans to discharge to daughter's home with multiple family members available to assist    Prior Function Level of Independence: Independent with assistive device(s)         Comments: Using RW recently due to increased  falls and hip pain     Hand Dominance   Dominant Hand: Right    Extremity/Trunk Assessment   Upper Extremity Assessment Upper Extremity Assessment: Generalized weakness    Lower  Extremity Assessment Lower Extremity Assessment: Generalized weakness    Cervical / Trunk Assessment Cervical / Trunk Assessment: Kyphotic  Communication   Communication: No difficulties  Cognition Arousal/Alertness: Awake/alert Behavior During Therapy: WFL for tasks assessed/performed Overall Cognitive Status: No family/caregiver present to determine baseline cognitive functioning Area of Impairment: Safety/judgement;Attention                   Current Attention Level: Selective     Safety/Judgement: Decreased awareness of safety;Decreased awareness of deficits     General Comments: Educated Pt on importance of O2 saturations      General Comments General comments (skin integrity, edema, etc.): SpO2 down to 80% on RA, returning to >90% on 2L O2 [place]    Exercises     Assessment/Plan    PT Assessment Patient needs continued PT services  PT Problem List Decreased strength;Decreased activity tolerance;Decreased balance;Decreased mobility;Cardiopulmonary status limiting activity;Decreased knowledge of use of DME       PT Treatment Interventions DME instruction;Gait training;Stair training;Functional mobility training;Therapeutic activities;Therapeutic exercise;Balance training;Patient/family education    PT Goals (Current goals can be found in the Care Plan section)  Acute Rehab PT Goals Patient Stated Goal: to enjoy retirement, breathe better PT Goal Formulation: With patient Time For Goal Achievement: 06/06/18 Potential to Achieve Goals: Good    Frequency Min 3X/week   Barriers to discharge        Co-evaluation PT/OT/SLP Co-Evaluation/Treatment: Yes Reason for Co-Treatment: For patient/therapist safety;To address functional/ADL transfers PT goals addressed during session: Mobility/safety with mobility;Balance;Proper use of DME OT goals addressed during session: ADL's and self-care;Proper use of Adaptive equipment and DME       AM-PAC PT "6 Clicks" Daily  Activity  Outcome Measure Difficulty turning over in bed (including adjusting bedclothes, sheets and blankets)?: A Little Difficulty moving from lying on back to sitting on the side of the bed? : A Little Difficulty sitting down on and standing up from a chair with arms (e.g., wheelchair, bedside commode, etc,.)?: A Little Help needed moving to and from a bed to chair (including a wheelchair)?: A Little Help needed walking in hospital room?: A Little Help needed climbing 3-5 steps with a railing? : A Little 6 Click Score: 18    End of Session Equipment Utilized During Treatment: Gait belt;Oxygen Activity Tolerance: Patient tolerated treatment well Patient left: in chair;with call bell/phone within reach;with chair alarm set Nurse Communication: Mobility status PT Visit Diagnosis: Other abnormalities of gait and mobility (R26.89)    Time: [id number] PT Time Calculation (min) (ACUTE ONLY): 26 min   Charges:   PT Evaluation $PT Eval Moderate Complexity: 1 Mod         Ina HomesJaclyn Trevonn Hallum, PT, DPT Acute Rehabilitation Services  Pager [phone] Office [phone]  Malachy ChamberJaclyn L Damonique Brunelle 05/23/2018, 11:54 AM

## 2018-05-23 NOTE — Progress Notes (Signed)
Pt still drinking first bottle of contrast  Educated pt on notifying staff on completion, pt understanding

## 2018-05-24 ENCOUNTER — Inpatient Hospital Stay (HOSPITAL_COMMUNITY): Payer: 59

## 2018-05-24 DIAGNOSIS — M549 Dorsalgia, unspecified: Secondary | ICD-10-CM | POA: Diagnosis present

## 2018-05-24 DIAGNOSIS — I340 Nonrheumatic mitral (valve) insufficiency: Secondary | ICD-10-CM

## 2018-05-24 DIAGNOSIS — E871 Hypo-osmolality and hyponatremia: Secondary | ICD-10-CM | POA: Diagnosis present

## 2018-05-24 DIAGNOSIS — R410 Disorientation, unspecified: Secondary | ICD-10-CM | POA: Diagnosis present

## 2018-05-24 DIAGNOSIS — K4190 Unilateral femoral hernia, without obstruction or gangrene, not specified as recurrent: Secondary | ICD-10-CM | POA: Diagnosis present

## 2018-05-24 DIAGNOSIS — W19XXXA Unspecified fall, initial encounter: Secondary | ICD-10-CM | POA: Diagnosis present

## 2018-05-24 DIAGNOSIS — Z880 Allergy status to penicillin: Secondary | ICD-10-CM | POA: Diagnosis not present

## 2018-05-24 DIAGNOSIS — Z716 Tobacco abuse counseling: Secondary | ICD-10-CM | POA: Diagnosis not present

## 2018-05-24 DIAGNOSIS — I5033 Acute on chronic diastolic (congestive) heart failure: Secondary | ICD-10-CM | POA: Diagnosis present

## 2018-05-24 DIAGNOSIS — J9601 Acute respiratory failure with hypoxia: Secondary | ICD-10-CM | POA: Diagnosis present

## 2018-05-24 DIAGNOSIS — I110 Hypertensive heart disease with heart failure: Secondary | ICD-10-CM | POA: Diagnosis present

## 2018-05-24 DIAGNOSIS — K5730 Diverticulosis of large intestine without perforation or abscess without bleeding: Secondary | ICD-10-CM | POA: Diagnosis present

## 2018-05-24 DIAGNOSIS — J9811 Atelectasis: Secondary | ICD-10-CM | POA: Diagnosis present

## 2018-05-24 DIAGNOSIS — R634 Abnormal weight loss: Secondary | ICD-10-CM | POA: Diagnosis present

## 2018-05-24 DIAGNOSIS — N1330 Unspecified hydronephrosis: Secondary | ICD-10-CM | POA: Diagnosis present

## 2018-05-24 DIAGNOSIS — D509 Iron deficiency anemia, unspecified: Secondary | ICD-10-CM | POA: Diagnosis present

## 2018-05-24 DIAGNOSIS — Z6823 Body mass index (BMI) 23.0-23.9, adult: Secondary | ICD-10-CM | POA: Diagnosis not present

## 2018-05-24 DIAGNOSIS — Q615 Medullary cystic kidney: Secondary | ICD-10-CM | POA: Diagnosis not present

## 2018-05-24 DIAGNOSIS — Y92009 Unspecified place in unspecified non-institutional (private) residence as the place of occurrence of the external cause: Secondary | ICD-10-CM | POA: Diagnosis not present

## 2018-05-24 DIAGNOSIS — N29 Other disorders of kidney and ureter in diseases classified elsewhere: Secondary | ICD-10-CM | POA: Diagnosis present

## 2018-05-24 DIAGNOSIS — R0902 Hypoxemia: Secondary | ICD-10-CM | POA: Diagnosis present

## 2018-05-24 LAB — BASIC METABOLIC PANEL
Anion gap: 6 (ref 5–15)
BUN: 7 mg/dL — AB (ref 8–23)
CHLORIDE: 81 mmol/L — AB (ref 98–111)
CO2: 42 mmol/L — AB (ref 22–32)
CREATININE: 0.53 mg/dL (ref 0.44–1.00)
Calcium: 8.8 mg/dL — ABNORMAL LOW (ref 8.9–10.3)
GFR calc Af Amer: >60 mL/min (ref >60)
GFR calc non Af Amer: >60 mL/min (ref >60)
GLUCOSE: 118 mg/dL — AB (ref 70–99)
POTASSIUM: 3.5 mmol/L (ref 3.5–5.1)
SODIUM: 129 mmol/L — AB (ref 135–145)

## 2018-05-24 LAB — ECHOCARDIOGRAM COMPLETE
Height: 64 in
Weight: 2102.4 oz

## 2018-05-24 LAB — HIV ANTIBODY (ROUTINE TESTING W REFLEX): HIV SCREEN 4TH GENERATION: NONREACTIVE

## 2018-05-24 MED ORDER — FERROUS SULFATE 325 (65 FE) MG PO TABS
325.0000 mg | ORAL_TABLET | Freq: Two times a day (BID) | ORAL | Status: DC
  Administered 2018-05-24 – 2018-05-27 (×7): 325 mg via ORAL
  Filled 2018-05-24 (×7): qty 1

## 2018-05-24 NOTE — Progress Notes (Signed)
Occupational Therapy Treatment Patient Details Name: Kim Wagner MRN: [id number] DOB: 01/09/1956 Today's Date: 05/24/2018    History of present illness Pt is a 62 y.o. female admitted 05/22/18 with c/o BLE weakness and SOB. Found to be hypoxic requiring 2L O2 . Admitted for presumed new diagnosis of CHF. PMH includes chronic back pain, DDD, tobacco use.    OT comments  Energy conservation handout provided. Pt appreciative.  Follow Up Recommendations  Supervision - Intermittent;Home health OT    Equipment Recommendations  Tub/shower seat    Recommendations for Other Services      Precautions / Restrictions Precautions Precautions: Fall Precaution Comments: increased falls leading up to admission Restrictions Weight Bearing Restrictions: No       Mobility Bed Mobility Overal bed mobility: Needs Assistance Bed Mobility: Supine to Sit     Supine to sit: Supervision;HOB elevated        Transfers Overall transfer level: Needs assistance Equipment used: Rolling walker (2 wheeled) Transfers: Sit to/from Stand Sit to Stand: Min guard;Supervision Stand pivot transfers: Min assist;Min guard       General transfer comment: Min guard progressing to supervision; cues for safe hand placement when standing from toilet        ADL either performed or assessed with clinical judgement   ADL                           Toilet Transfer: Min guard;Ambulation;RW;Comfort height toilet;Grab bars Toilet Transfer Details (indicate cue type and reason): incontinent getting to commode ( pt had had lasix)  Explained risk of falling and pt stated daughter was bringing her depends Toileting- Architect and Hygiene: Supervision/safety;Sit to/from stand         General ADL Comments: Energy conservation handout provided and gone over in detail.  Pt able to use practical examples that she can apply to her daily life     Vision Baseline Vision/History: Wears  glasses Patient Visual Report: No change from baseline            Cognition Arousal/Alertness: Awake/alert Behavior During Therapy: WFL for tasks assessed/performed Overall Cognitive Status: Within Functional Limits for tasks assessed Area of Impairment: Safety/judgement;Attention                   Current Attention Level: Selective     Safety/Judgement: Decreased awareness of safety;Decreased awareness of deficits                         Pertinent Vitals/ Pain       Pain Assessment: No/denies pain         Frequency  Min 2X/week        Progress Toward Goals  OT Goals(current goals can now be found in the care plan section)  Progress towards OT goals: Progressing toward goals     Plan Discharge plan remains appropriate;Discharge plan needs to be updated    Co-evaluation                 AM-PAC PT "6 Clicks" Daily Activity     Outcome Measure   Help from another person eating meals?: None Help from another person taking care of personal grooming?: A Little Help from another person toileting, which includes using toliet, bedpan, or urinal?: A Little Help from another person bathing (including washing, rinsing, drying)?: A Little Help from another person to put on and taking off regular upper body clothing?: None  Help from another person to put on and taking off regular lower body clothing?: A Little 6 Click Score: 20    End of Session Equipment Utilized During Treatment: Gait belt;Rolling walker;Oxygen(2L)  OT Visit Diagnosis: Unsteadiness on feet (R26.81);Muscle weakness (generalized) (M62.81)   Activity Tolerance Patient tolerated treatment well   Patient Left in chair;with call bell/phone within reach;with chair alarm set   Nurse Communication Mobility status        Time: 2563-8937 OT Time Calculation (min): 16 min  Charges: OT General Charges $OT Visit: 1 Visit OT Treatments $Self Care/Home Management : 8-22 mins  [place], [place] [phone]   Alba Cory 05/24/2018, 12:50 PM

## 2018-05-24 NOTE — Progress Notes (Signed)
PROGRESS NOTE    Kim Wagner  ZOX:[id number] DOB: [birth year] DOA: 05/22/2018 PCP: Hadley Pen, MD    Brief Narrative:  Kim Wagner is a 62 y.o. female with medical history significant of chronic back pain, has been having progressive lower extremity edema and shortness of breath has been falling at home . Patient has been having severe hip pain MRI of the back in May showed DDD patient is to follow-up with Dr. Venetia Maxon. She was admitted for hypoxia with sats in 70's, has an extensive smoking history.  She was admitted for evaluation of CHF.   Assessment & Plan:   Active Problems:   Dyspnea   Leg edema   Back pain   Anemia   Hyponatremia   Fluid overload   Dyspnea, hypoxia, leg edema, elevated BNP, fluid overload with hyponatremia.  Admitted for evaluation of CHF.  CT chest does not show any acute abnormality. Echocardiogram ordered.  This pending [place] oxygen to keep sats greater than 90%.  Serial troponins negative.  Started on IV lasix 40 mg BID.  Has good urine output.  3 L of  diuresis since admission Strict intake and output.  Daily weights.  Continue IV Lasix 40 mg twice daily for another 24 hours. Replete potassium as needed.     Hyponatremia: from fluid overload. Improving.  Sodium at 130.  Repeat labs today    Iron deficiency anemia:  Will need iron supplementation on discharge.  Hemoglobin stable at 9.  CT abd and pelvis does not show any acute abnormality.        DVT prophylaxis: lovenox.  Code Status: DNR Family Communication:none at bedside.  Disposition Plan: pending further evaluation by echocardiogram and PT evaluation.   Consultants:   None.    Procedures: echocardiogram.    Antimicrobials:none.      Subjective: No chest pain , breathing has improved from yesterday.  She is not back to baseline yet.  Objective: Vitals:   05/24/18 0006 05/24/18 0252 05/24/18 0417 05/24/18 0934  BP: 131/75  125/74 104/61  Pulse: 79  80 77    Resp: 18  18   Temp: 98.7 F (37.1 C)  98.6 F (37 C)   TempSrc: Oral  Oral   SpO2: 98%  96% 96%  Weight:  59.6 kg    Height:        Intake/Output Summary (Last 24 hours) at 05/24/2018 1007 Last data filed at 05/24/2018 0900 Gross per 24 hour  Intake 1300 ml  Output 2650 ml  Net -1350 ml   Filed Weights   05/22/18 1627 05/23/18 0201 05/24/18 0252  Weight: 61.2 kg 62.6 kg 59.6 kg    Examination:  General exam: Appears calm and comfortable on 2 L of nasal cannula oxygen Respiratory system: Clear to auscultation. Respiratory effort normal.  No wheezing or rhonchi Cardiovascular system: S1 & S2 heard, RRR. No JVD, murmurs, Gastrointestinal system: Abdomen is soft, nontender, nondistended with good bowel sounds Central nervous system: Alert and oriented. No focal neurological deficits. Extremities: 2+ leg edema. Skin: No rashes, lesions or ulcers Psychiatry:  Mood & affect appropriate.     Data Reviewed: I have personally reviewed following labs and imaging studies  CBC: Recent Labs  Lab 05/22/18 1800 05/22/18 2137 05/23/18 0827  WBC 10.4 10.7* 8.3  NEUTROABS  --  7.0 5.4  HGB 9.3* 9.7* 9.5*  HCT 31.7* 33.3* 32.7*  MCV 76.0* 76.2* 75.7*  PLT 358 363 346   Basic Metabolic Panel: Recent Labs  Lab  05/22/18 1800 05/23/18 0827  NA 127* 130*  K 3.8 3.5  CL 87* 85*  CO2 31 37*  GLUCOSE 95 100*  BUN 9 <5*  CREATININE 0.46 0.45  CALCIUM 8.6* 8.8*  MG  --  1.7  PHOS  --  3.7   GFR: Estimated Creatinine Clearance: 61.3 mL/min (by C-G formula based on SCr of 0.45 mg/dL). Liver Function Tests: Recent Labs  Lab 05/22/18 1800 05/23/18 0827  AST 14* 15  ALT 11 12  ALKPHOS 159* 158*  BILITOT 0.6 0.7  PROT 5.4* 5.7*  ALBUMIN 3.0* 2.9*   No results for input(s): LIPASE, AMYLASE in the last 168 hours. Recent Labs  Lab 05/23/18 0315  AMMONIA 34   Coagulation Profile: Recent Labs  Lab 05/23/18 0315  INR 1.04   Cardiac Enzymes: Recent Labs  Lab  05/22/18 1800 05/22/18 2137 05/23/18 0315 05/23/18 0827  TROPONINI <0.03 <0.03 <0.03 <0.03   BNP (last 3 results) No results for input(s): PROBNP in the last 8760 hours. HbA1C: No results for input(s): HGBA1C in the last 72 hours. CBG: No results for input(s): GLUCAP in the last 168 hours. Lipid Profile: No results for input(s): CHOL, HDL, LDLCALC, TRIG, CHOLHDL, LDLDIRECT in the last 72 hours. Thyroid Function Tests: Recent Labs    05/23/18 0315  TSH 0.846   Anemia Panel: Recent Labs    05/22/18 2137  VITAMINB12 195  FOLATE 26.0  FERRITIN 9*  TIBC 465*  IRON 17*  RETICCTPCT 1.5   Sepsis Labs: No results for input(s): PROCALCITON, LATICACIDVEN in the last 168 hours.  No results found for this or any previous visit (from the past 240 hour(s)).       Radiology Studies: Dg Chest 2 View  Result Date: 05/22/2018 CLINICAL DATA:  62 year old female with a history of weakness and shortness of breath EXAM: CHEST - 2 VIEW COMPARISON:  01/02/2018 FINDINGS: Cardiomediastinal silhouette unchanged in size and contour. No evidence of central vascular congestion. Patchy opacities at the lung bases with associated small pleural effusions. Chronic coarsening of interstitial markings. No pneumothorax. Thoracic spine degenerative changes.  No acute displaced fracture. IMPRESSION: Bilateral small pleural effusions with associated atelectasis/consolidation. If there is concern for pneumonia, recommend correlation with lab values. Multilevel degenerative changes of the spine. Electronically Signed   By: Gilmer Mor D.O.   On: 05/22/2018 17:27   Ct Head Wo Contrast  Result Date: 05/23/2018 CLINICAL DATA:  Patient with weakness. EXAM: CT HEAD WITHOUT CONTRAST TECHNIQUE: Contiguous axial images were obtained from the base of the skull through the vertex without intravenous contrast. COMPARISON:  Brain CT 12/06/2017 FINDINGS: Brain: Ventricles and sulci are appropriate for patient's age. No  evidence for acute cortically based infarct, intracranial hemorrhage, mass lesion or mass-effect. Periventricular and subcortical white matter hypodensity compatible with chronic microvascular ischemic changes. Vascular: Internal carotid arterial vascular calcifications. Skull: Intact. Sinuses/Orbits: Paranasal sinuses are well aerated. Mastoid air cells unremarkable. Orbits are unremarkable. Other: None. IMPRESSION: No acute intracranial process. Atrophy and chronic microvascular ischemic changes. Electronically Signed   By: Annia Belt M.D.   On: 05/23/2018 01:45   Ct Chest W Contrast  Result Date: 05/23/2018 CLINICAL DATA:  62 year old female with significant unintentional weight loss and decreased appetite EXAM: CT CHEST, ABDOMEN, AND PELVIS WITH CONTRAST TECHNIQUE: Multidetector CT imaging of the chest, abdomen and pelvis was performed following the standard protocol during bolus administration of intravenous contrast. CONTRAST:  ISOVUE-300 IOPAMIDOL (ISOVUE-300) INJECTION 61% COMPARISON:  Prior CT scan of the chest  01/01/2018 FINDINGS: CT CHEST FINDINGS Cardiovascular: Conventional 3 vessel arch anatomy. Elongation of the aortic infundibulum and descending thoracic aorta consistent with a type 3 arch. Cardiac motion limits ability to precisely measure the ascending thoracic aorta. There has been no interval enlargement compared to the relatively recent prior imaging. The maximal diameter is no greater than 4 cm and is suspected to be less. Normal caliber main and central pulmonary arteries. No evidence of central pulmonary embolus. Calcifications present along the left anterior descending coronary artery. Mild cardiomegaly with left heart enlargement. No pericardial effusion. Mediastinum/Nodes: Unremarkable CT appearance of the thyroid gland. No suspicious mediastinal or hilar adenopathy. No soft tissue mediastinal mass. The thoracic esophagus is unremarkable. Lungs/Pleura: Moderate respiratory  motion artifact. Atelectasis is present in both lower lobes. No evidence of pulmonary edema, pleural effusion or pneumothorax. No focal airspace consolidation. No suspicious pulmonary nodule or mass. Musculoskeletal: No acute fracture or aggressive appearing lytic or blastic osseous lesion. CT ABDOMEN PELVIS FINDINGS Hepatobiliary: Normal hepatic contour and morphology. No discrete hepatic lesions. Normal appearance of the gallbladder. No intra or extrahepatic biliary ductal dilatation. Pancreas: Unremarkable. No pancreatic ductal dilatation or surrounding inflammatory changes. Spleen: Normal in size without focal abnormality. Adrenals/Urinary Tract: Unremarkable adrenal glands. Calcification of the renal pyramids in both the upper and lower pole of the left kidney. There is mild left hydroureteronephrosis. The ureter is mildly dilated to the level of the UVJ. No obstructing lesion or stone identified. On the right, there is no hydronephrosis or nephrocalcinosis. Stomach/Bowel: Colonic diverticular disease without CT evidence of active inflammation. No focal bowel wall thickening or evidence of obstruction. Vascular/Lymphatic: Atherosclerotic calcifications present throughout the abdominal aorta. No evidence of aneurysm. The SMA, celiac artery and IMA all remain patent without evidence to suggest high-grade stenosis. Reproductive: Surgical changes of prior hysterectomy. Mild pelvic floor laxity. Other: Moderately large right femoral hernia containing multiple loops of small bowel, mesentery and omental fat. Musculoskeletal: No acute fracture or aggressive appearing lytic or blastic osseous lesion. Mild levoconvex scoliosis centered at L1. exaggerated thoracic kyphosis. Limbus vertebra at L3. Mild grade 1 anterolisthesis of L4 on L5. IMPRESSION: 1. No acute abnormality within the chest abdomen or pelvis. 2. While there is mild aortic atherosclerosis, the visceral arteries all appear patent without evidence of  significant stenosis or occlusion to suggest a source for chronic mesenteric ischemia. Aortic Atherosclerosis (ICD10-170.0) 3. 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 4. Mild left hydroureteronephrosis without evidence of distal ureteral stone, stenosis or lesion. This may be secondary to reflux, or a bladder lesion in the region of the UVJ smaller than the resolution of CT. Consider correlation with urine cytology and potentially cystoscopy. 5. 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 6. Coronary artery calcifications. 7. Mild cardiomegaly. 8. Moderately large right femoral hernia containing multiple loops of small bowel, mesentery and omental fat. 9. Colonic diverticular disease without CT evidence of active inflammation. 10. Pelvic floor laxity. Electronically Signed   By: Malachy Moan M.D.   On: 05/23/2018 12:09   Ct Abdomen Pelvis W Contrast  Result Date: 05/23/2018 CLINICAL DATA:  62 year old female with significant unintentional weight loss and decreased appetite EXAM: CT CHEST, ABDOMEN, AND PELVIS WITH CONTRAST TECHNIQUE: Multidetector CT imaging of the chest, abdomen and pelvis was performed following the standard protocol during bolus administration of intravenous contrast. CONTRAST:  ISOVUE-300 IOPAMIDOL (ISOVUE-300) INJECTION 61% COMPARISON:  Prior CT scan of the chest 01/01/2018  FINDINGS: CT CHEST FINDINGS Cardiovascular: Conventional 3 vessel arch anatomy. Elongation of the aortic infundibulum and descending thoracic aorta consistent with a type 3 arch. Cardiac motion limits ability to precisely measure the ascending thoracic aorta. There has been no interval enlargement compared to the relatively recent prior imaging. The maximal diameter is no greater than 4 cm and is suspected to be less. Normal caliber main and central pulmonary arteries. No evidence of  central pulmonary embolus. Calcifications present along the left anterior descending coronary artery. Mild cardiomegaly with left heart enlargement. No pericardial effusion. Mediastinum/Nodes: Unremarkable CT appearance of the thyroid gland. No suspicious mediastinal or hilar adenopathy. No soft tissue mediastinal mass. The thoracic esophagus is unremarkable. Lungs/Pleura: Moderate respiratory motion artifact. Atelectasis is present in both lower lobes. No evidence of pulmonary edema, pleural effusion or pneumothorax. No focal airspace consolidation. No suspicious pulmonary nodule or mass. Musculoskeletal: No acute fracture or aggressive appearing lytic or blastic osseous lesion. CT ABDOMEN PELVIS FINDINGS Hepatobiliary: Normal hepatic contour and morphology. No discrete hepatic lesions. Normal appearance of the gallbladder. No intra or extrahepatic biliary ductal dilatation. Pancreas: Unremarkable. No pancreatic ductal dilatation or surrounding inflammatory changes. Spleen: Normal in size without focal abnormality. Adrenals/Urinary Tract: Unremarkable adrenal glands. Calcification of the renal pyramids in both the upper and lower pole of the left kidney. There is mild left hydroureteronephrosis. The ureter is mildly dilated to the level of the UVJ. No obstructing lesion or stone identified. On the right, there is no hydronephrosis or nephrocalcinosis. Stomach/Bowel: Colonic diverticular disease without CT evidence of active inflammation. No focal bowel wall thickening or evidence of obstruction. Vascular/Lymphatic: Atherosclerotic calcifications present throughout the abdominal aorta. No evidence of aneurysm. The SMA, celiac artery and IMA all remain patent without evidence to suggest high-grade stenosis. Reproductive: Surgical changes of prior hysterectomy. Mild pelvic floor laxity. Other: Moderately large right femoral hernia containing multiple loops of small bowel, mesentery and omental fat. Musculoskeletal: No  acute fracture or aggressive appearing lytic or blastic osseous lesion. Mild levoconvex scoliosis centered at L1. exaggerated thoracic kyphosis. Limbus vertebra at L3. Mild grade 1 anterolisthesis of L4 on L5. IMPRESSION: 1. No acute abnormality within the chest abdomen or pelvis. 2. While there is mild aortic atherosclerosis, the visceral arteries all appear patent without evidence of significant stenosis or occlusion to suggest a source for chronic mesenteric ischemia. Aortic Atherosclerosis (ICD10-170.0) 3. 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 4. Mild left hydroureteronephrosis without evidence of distal ureteral stone, stenosis or lesion. This may be secondary to reflux, or a bladder lesion in the region of the UVJ smaller than the resolution of CT. Consider correlation with urine cytology and potentially cystoscopy. 5. 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 6. Coronary artery calcifications. 7. Mild cardiomegaly. 8. Moderately large right femoral hernia containing multiple loops of small bowel, mesentery and omental fat. 9. Colonic diverticular disease without CT evidence of active inflammation. 10. Pelvic floor laxity. Electronically Signed   By: Malachy Moan M.D.   On: 05/23/2018 12:09        Scheduled Meds: . aspirin EC  81 mg Oral Daily  . feeding supplement (ENSURE ENLIVE)  237 mL Oral BID BM  . ferrous sulfate  325 mg Oral BID WC  . furosemide  40 mg Intravenous BID  . mouth rinse  15 mL Mouth Rinse BID  . nicotine  21 mg Transdermal Daily  . ramipril  1.25 mg Oral  BID  . sodium chloride flush  3 mL Intravenous Q12H  . thiamine  100 mg Oral Daily   Continuous Infusions: . sodium chloride       LOS: 1 day    Time spent: 35 minutes.     Kathlen Mody, MD [facility] Pager [phone]   If 7PM-7AM, please contact  night-coverage [email or web address] Password [email or web address] 05/24/2018, 10:07 AM

## 2018-05-24 NOTE — Progress Notes (Signed)
*  PRELIMINARY RESULTS* Echocardiogram 2D Echocardiogram has been performed.  Stacey Drain 05/24/2018, 2:03 PM

## 2018-05-24 NOTE — Progress Notes (Signed)
Pt has not had a BM since 8/29- is on pain medication.  Paged MD to request a stool softener.

## 2018-05-25 LAB — BASIC METABOLIC PANEL
ANION GAP: 6 (ref 5–15)
BUN: 6 mg/dL — ABNORMAL LOW (ref 8–23)
CALCIUM: 9.2 mg/dL (ref 8.9–10.3)
CHLORIDE: 83 mmol/L — AB (ref 98–111)
CO2: 43 mmol/L — AB (ref 22–32)
Creatinine, Ser: 0.62 mg/dL (ref 0.44–1.00)
GFR calc non Af Amer: >60 mL/min (ref >60)
Glucose, Bld: 103 mg/dL — ABNORMAL HIGH (ref 70–99)
POTASSIUM: 4.2 mmol/L (ref 3.5–5.1)
Sodium: 132 mmol/L — ABNORMAL LOW (ref 135–145)

## 2018-05-25 MED ORDER — FUROSEMIDE 10 MG/ML IJ SOLN
40.0000 mg | Freq: Once | INTRAMUSCULAR | Status: AC
  Administered 2018-05-25: 40 mg via INTRAVENOUS
  Filled 2018-05-25: qty 4

## 2018-05-25 MED ORDER — SODIUM CHLORIDE 0.9 % IV SOLN
500.0000 mg | Freq: Once | INTRAVENOUS | Status: AC
  Administered 2018-05-25: 500 mg via INTRAVENOUS
  Filled 2018-05-25 (×2): qty 10

## 2018-05-25 MED ORDER — SENNOSIDES-DOCUSATE SODIUM 8.6-50 MG PO TABS
2.0000 | ORAL_TABLET | Freq: Two times a day (BID) | ORAL | Status: DC
  Administered 2018-05-25 – 2018-05-26 (×2): 2 via ORAL
  Filled 2018-05-25 (×5): qty 2

## 2018-05-25 MED ORDER — VITAMIN B-12 1000 MCG PO TABS
500.0000 ug | ORAL_TABLET | Freq: Every day | ORAL | Status: DC
  Administered 2018-05-25 – 2018-05-27 (×3): 500 ug via ORAL
  Filled 2018-05-25 (×3): qty 1

## 2018-05-25 MED ORDER — SODIUM CHLORIDE 0.9 % IV SOLN
25.0000 mg | Freq: Once | INTRAVENOUS | Status: AC
  Administered 2018-05-25: 25 mg via INTRAVENOUS
  Filled 2018-05-25: qty 0.5

## 2018-05-25 MED ORDER — SODIUM CHLORIDE 0.9 % IV SOLN: INTRAVENOUS | Status: DC | PRN

## 2018-05-25 MED ORDER — FUROSEMIDE 40 MG PO TABS
40.0000 mg | ORAL_TABLET | Freq: Every day | ORAL | Status: DC
  Administered 2018-05-26 – 2018-05-27 (×2): 40 mg via ORAL
  Filled 2018-05-25 (×2): qty 1

## 2018-05-25 MED ORDER — POLYETHYLENE GLYCOL 3350 17 G PO PACK
17.0000 g | PACK | Freq: Every day | ORAL | Status: DC
  Filled 2018-05-25 (×2): qty 1

## 2018-05-25 NOTE — Progress Notes (Signed)
Physical Therapy Treatment Patient Details Name: Kim Wagner MRN: [id number] DOB: November 02, 1955 Today's Date: 05/25/2018    History of Present Illness Pt is a 62 y.o. female admitted 05/22/18 with c/o BLE weakness and SOB. Found to be hypoxic requiring 2L O2 [place]. Admitted for presumed new diagnosis of CHF. PMH includes chronic back pain, DDD, tobacco use.     PT Comments    Pt much improved from last session. Pt able to maintain SpO2 >94% on RA during ambulation however drops to 90% when trying to talk and walker. Acute PT to cont to follow.   Follow Up Recommendations  Outpatient PT;Supervision for mobility/OOB     Equipment Recommendations  None recommended by PT    Recommendations for Other Services       Precautions / Restrictions Precautions Precautions: Fall Precaution Comments: increased falls leading up to admission Restrictions Weight Bearing Restrictions: No    Mobility  Bed Mobility               General bed mobility comments: pt up in chair upon PT arrival  Transfers Overall transfer level: Needs assistance Equipment used: Rolling walker (2 wheeled) Transfers: Sit to/from Stand Sit to Stand: Supervision         General transfer comment: pt with good technique, pushing up from arm rests, steady during transition of hands  Ambulation/Gait Ambulation/Gait assistance: Supervision Gait Distance (Feet): 120 Feet Assistive device: Rolling walker (2 wheeled) Gait Pattern/deviations: Step-through pattern;Decreased stride length;Trunk flexed Gait velocity: Decreased Gait velocity interpretation: 1.31 - 2.62 ft/sec, indicative of limited community ambulator General Gait Details: pt much improved from last session, SPO2 l94% on 2LO2 via [place], drops to 90% when talking and walking. verbal cues to stay in walker, stand up straight and look forward, with onset of fatigue pt with increased trunk flexion and inability to maintain upright posture   Stairs              Wheelchair Mobility    Modified Rankin (Stroke Patients Only)       Balance           Standing balance support: Single extremity supported Standing balance-Leahy Scale: Fair Standing balance comment: pt able to complete std pvt transfer to chair with min guard without AD                            Cognition Arousal/Alertness: Awake/alert Behavior During Therapy: WFL for tasks assessed/performed Overall Cognitive Status: Within Functional Limits for tasks assessed                                        Exercises      General Comments General comments (skin integrity, edema, etc.): pt assisted to [facility], pt indep with hygiene      Pertinent Vitals/Pain Pain Assessment: No/denies pain    Home Living                      Prior Function            PT Goals (current goals can now be found in the care plan section) Acute Rehab PT Goals Patient Stated Goal: to enjoy retirement Progress towards PT goals: Progressing toward goals    Frequency    Min 3X/week      PT Plan Current plan remains appropriate    Co-evaluation  AM-PAC PT "6 Clicks" Daily Activity  Outcome Measure  Difficulty turning over in bed (including adjusting bedclothes, sheets and blankets)?: A Little Difficulty moving from lying on back to sitting on the side of the bed? : A Little Difficulty sitting down on and standing up from a chair with arms (e.g., wheelchair, bedside commode, etc,.)?: A Little Help needed moving to and from a bed to chair (including a wheelchair)?: A Little Help needed walking in hospital room?: A Little Help needed climbing 3-5 steps with a railing? : A Little 6 Click Score: 18    End of Session Equipment Utilized During Treatment: Gait belt;Oxygen Activity Tolerance: Patient tolerated treatment well Patient left: in chair;with call bell/phone within reach;with chair alarm set Nurse Communication: Mobility  status PT Visit Diagnosis: Other abnormalities of gait and mobility (R26.89)     Time: 2683-4196 PT Time Calculation (min) (ACUTE ONLY): 20 min  Charges:  $Gait Training: 8-22 mins                     Lewis Shock, PT, DPT Acute Rehabilitation Services Pager #: [phone] Office #: [phone]    Iona Hansen 05/25/2018, 10:05 AM

## 2018-05-25 NOTE — Progress Notes (Addendum)
SATURATION QUALIFICATIONS: (This note is used to comply with regulatory documentation for home oxygen)  Patient Saturations on Room Air at Rest = 95%  Patient Saturations on Room Air while Ambulating = 77% Patient was able to ambulate about 100 ft with O2 around 92-94 % before O2 sat dropped to 78-80%  Patient Saturations on 3 Liters of oxygen while Ambulating = 99-100%  Once back in room- maintain O2 above 92% at 2L.   Walked about 200 ft total - did not feel any SOB, dizziness, etc.

## 2018-05-25 NOTE — Progress Notes (Signed)
PROGRESS NOTE    Kim Wagner  CXK:[id number] DOB: 05/02/1956 DOA: 05/22/2018 PCP: Hadley Pen, MD    Brief Narrative:  Kim Wagner is a 62 y.o. female with medical history significant of chronic back pain, has been having progressive lower extremity edema and shortness of breath has been falling at home . Patient has been having severe hip pain MRI of the back in May showed DDD patient is to follow-up with Dr. Venetia Maxon. She was admitted for hypoxia with sats in 70's, has an extensive smoking history.  She was admitted for evaluation of CHF.   Assessment & Plan:   Active Problems:   Dyspnea   Leg edema   Back pain   Anemia   Hyponatremia   Fluid overload   Dyspnea, hypoxia, leg edema, elevated BNP, fluid overload with hyponatremia.  Admitted for evaluation of CHF.  CT chest does not show any acute abnormality. Echocardiogram ordered showed preserved LVEF, with indeterminate diastolic dysfunction and moderate pulmonary hypertension.  [place] oxygen to keep sats greater than 90%.  Serial troponins negative.  Started on IV lasix 40 mg BID.  Has good urine output.  5.6 L of  diuresis since admission Strict intake and output.  Daily weights.  Change IV lasix to po lasix starting tomorrow.  Replete potassium as needed. Creatinine remains stable on diuresis.     Hyponatremia: from fluid overload. Improving.  Sodium at 132.  Improving with diuresis.     Iron deficiency anemia:  Will need iron supplementation on discharge.  Hemoglobin stable at 9.  CT abd and pelvis does not show any acute abnormality.  IV iron infusion ordered.     Confusion, as per the daughter, she has bouts of confusion intermittently,  Initial CT head negative.  tsh wnl.  Low normal B12 levels.  Replete B12, pt will need dementia work up as outpatient.  Currently no focal deficits at this time.     Tobacco abuse: On nicotine patch.    Hypertension: well controlled.     DVT prophylaxis:  lovenox.  Code Status: DNR Family Communication:none at bedside.  Disposition Plan: pending further evaluation by echocardiogram and PT evaluation.   Consultants:   None.    Procedures: echocardiogram.    Antimicrobials:none.      Subjective: No chest pain , breathing has improved from yesterday.  She is not back to baseline yet.  Objective: Vitals:   05/25/18 0000 05/25/18 0458 05/25/18 1000 05/25/18 1143  BP: 132/80 121/80 104/69 93/64  Pulse: 79 82 86 79  Resp: 18 19  18   Temp: 98.7 F (37.1 C) 98.9 F (37.2 C)  98.6 F (37 C)  TempSrc: Oral Oral  Oral  SpO2: 96% 98% 97% 98%  Weight:  59 kg    Height:        Intake/Output Summary (Last 24 hours) at 05/25/2018 1652 Last data filed at 05/25/2018 1603 Gross per 24 hour  Intake 840 ml  Output 3565 ml  Net -2725 ml   Filed Weights   05/23/18 0201 05/24/18 0252 05/25/18 0458  Weight: 62.6 kg 59.6 kg 59 kg    Examination:  General exam: Appears calm and comfortable on 2 L of nasal cannula oxygen Respiratory system: diminished air entry at bases. No wheezing or rhonchi.  Cardiovascular system: S1 & S2 heard, RRR. No JVD, murmurs, Gastrointestinal system: Abdomen is soft, NT ND BS+ Central nervous system: Alert and oriented. No focal neurological deficits. Extremities: 2+ leg edema. Skin: No rashes, lesions or  ulcers Psychiatry:  Mood & affect appropriate.     Data Reviewed: I have personally reviewed following labs and imaging studies  CBC: Recent Labs  Lab 05/22/18 1800 05/22/18 2137 05/23/18 0827  WBC 10.4 10.7* 8.3  NEUTROABS  --  7.0 5.4  HGB 9.3* 9.7* 9.5*  HCT 31.7* 33.3* 32.7*  MCV 76.0* 76.2* 75.7*  PLT 358 363 346   Basic Metabolic Panel: Recent Labs  Lab 05/22/18 1800 05/23/18 0827 05/24/18 1103 05/25/18 1009  NA 127* 130* 129* 132*  K 3.8 3.5 3.5 4.2  CL 87* 85* 81* 83*  CO2 31 37* 42* 43*  GLUCOSE 95 100* 118* 103*  BUN 9 <5* 7* 6*  CREATININE 0.46 0.45 0.53 0.62  CALCIUM  8.6* 8.8* 8.8* 9.2  MG  --  1.7  --   --   PHOS  --  3.7  --   --    GFR: Estimated Creatinine Clearance: 61.3 mL/min (by C-G formula based on SCr of 0.62 mg/dL). Liver Function Tests: Recent Labs  Lab 05/22/18 1800 05/23/18 0827  AST 14* 15  ALT 11 12  ALKPHOS 159* 158*  BILITOT 0.6 0.7  PROT 5.4* 5.7*  ALBUMIN 3.0* 2.9*   No results for input(s): LIPASE, AMYLASE in the last 168 hours. Recent Labs  Lab 05/23/18 0315  AMMONIA 34   Coagulation Profile: Recent Labs  Lab 05/23/18 0315  INR 1.04   Cardiac Enzymes: Recent Labs  Lab 05/22/18 1800 05/22/18 2137 05/23/18 0315 05/23/18 0827  TROPONINI <0.03 <0.03 <0.03 <0.03   BNP (last 3 results) No results for input(s): PROBNP in the last 8760 hours. HbA1C: No results for input(s): HGBA1C in the last 72 hours. CBG: No results for input(s): GLUCAP in the last 168 hours. Lipid Profile: No results for input(s): CHOL, HDL, LDLCALC, TRIG, CHOLHDL, LDLDIRECT in the last 72 hours. Thyroid Function Tests: Recent Labs    05/23/18 0315  TSH 0.846   Anemia Panel: Recent Labs    05/22/18 2137  VITAMINB12 195  FOLATE 26.0  FERRITIN 9*  TIBC 465*  IRON 17*  RETICCTPCT 1.5   Sepsis Labs: No results for input(s): PROCALCITON, LATICACIDVEN in the last 168 hours.  No results found for this or any previous visit (from the past 240 hour(s)).       Radiology Studies: No results found.      Scheduled Meds: . aspirin EC  81 mg Oral Daily  . feeding supplement (ENSURE ENLIVE)  237 mL Oral BID BM  . ferrous sulfate  325 mg Oral BID WC  . furosemide  40 mg Intravenous BID  . mouth rinse  15 mL Mouth Rinse BID  . nicotine  21 mg Transdermal Daily  . polyethylene glycol  17 g Oral Daily  . ramipril  1.25 mg Oral BID  . senna-docusate  2 tablet Oral BID  . sodium chloride flush  3 mL Intravenous Q12H  . thiamine  100 mg Oral Daily  . vitamin B-12  500 mcg Oral Daily   Continuous Infusions: . sodium chloride        LOS: 2 days    Time spent: 35 minutes.     Kathlen Mody, MD [facility] Pager [phone]   If 7PM-7AM, please contact night-coverage [email or web address] Password [facility] 05/25/2018, 4:52 PM

## 2018-05-25 NOTE — Care Management Note (Addendum)
Case Management Note [name] RN, BSN Unit 4E- RN Care Coordinator -- cross coverage for 3E on sept 2 [phone]  Patient Details  Name: Kim Wagner MRN: [id number] Date of Birth: August 14, 1956  Subjective/Objective:  Pt admitted with leg edema- vol overload                  Action/Plan: PTA pt lived at home, noted orders for home 02 and HHRN/PT/aide- spoke with pt at bedside regarding transition of care needs. Per pt she lives in [place], however she may plan to stay with daughter here in [place]. She will plan to speak with her daughter today about plans and HH. Discussed with pt recommendations for [facility] and MD orders. Pt wants to speak with daughter before making a decision about HH or agency. Per RN, PT to walk pt again later today for qualifying note- pt may not need home 02 or qualify for it under Medicare guidelines- CM will f/u with pt regarding 02 needs. Confirmed pt's PCP, and noted pt does not have insurance- per pt she had insurance up until Aug. 31 of this year and then was to begin with a new insurance starting this month. Pt is to have daughter bring her insurance card that just expired so that it can be scanned into epic, and also f/u with what her new insurance is going to be. As far as she knows she has not received a new card yet. CM will f/u with this prior to pt's transition home.   Expected Discharge Date:                  Expected Discharge Plan:  Home w Home Health Services  In-House Referral:     Discharge planning Services  CM Consult  Post Acute Care Choice:  Durable Medical Equipment, Home Health Choice offered to:  Patient  DME Arranged:    DME Agency:     HH Arranged:  RN, PT, Nurse's Aide HH Agency:     Status of Service:  In process, will continue to follow  If discussed at Long Length of Stay Meetings, dates discussed:    Discharge Disposition:   Additional Comments:  Darrold Span, RN 05/25/2018, 11:25 AM

## 2018-05-26 DIAGNOSIS — I509 Heart failure, unspecified: Secondary | ICD-10-CM

## 2018-05-26 DIAGNOSIS — R0902 Hypoxemia: Secondary | ICD-10-CM

## 2018-05-26 DIAGNOSIS — R600 Localized edema: Secondary | ICD-10-CM

## 2018-05-26 DIAGNOSIS — E8770 Fluid overload, unspecified: Secondary | ICD-10-CM

## 2018-05-26 DIAGNOSIS — R0600 Dyspnea, unspecified: Secondary | ICD-10-CM

## 2018-05-26 MED ORDER — FUROSEMIDE 40 MG PO TABS: 40.0000 mg | ORAL_TABLET | Freq: Every day | ORAL | 1 refills | Status: DC

## 2018-05-26 MED ORDER — RAMIPRIL 1.25 MG PO CAPS: 1.2500 mg | ORAL_CAPSULE | Freq: Two times a day (BID) | ORAL | 0 refills | Status: DC

## 2018-05-26 MED ORDER — CYANOCOBALAMIN 500 MCG PO TABS: 500.0000 ug | ORAL_TABLET | Freq: Every day | ORAL | 0 refills | Status: AC

## 2018-05-26 MED ORDER — ENSURE ENLIVE PO LIQD: 237.0000 mL | Freq: Two times a day (BID) | ORAL | 12 refills | Status: DC

## 2018-05-26 MED ORDER — THIAMINE HCL 100 MG PO TABS: 100.0000 mg | ORAL_TABLET | Freq: Every day | ORAL | 0 refills | Status: AC

## 2018-05-26 MED ORDER — FERROUS SULFATE 325 (65 FE) MG PO TABS: 325.0000 mg | ORAL_TABLET | Freq: Two times a day (BID) | ORAL | 3 refills | Status: AC

## 2018-05-26 MED ORDER — NICOTINE 21 MG/24HR TD PT24: 21.0000 mg | MEDICATED_PATCH | Freq: Every day | TRANSDERMAL | 0 refills | Status: AC

## 2018-05-26 NOTE — Plan of Care (Signed)
  Problem: Nutrition: Goal: Adequate nutrition will be maintained Outcome: Progressing   Problem: Safety: Goal: Ability to remain free from injury will improve Outcome: Progressing   Problem: Skin Integrity: Goal: Risk for impaired skin integrity will decrease Outcome: Progressing   

## 2018-05-26 NOTE — Progress Notes (Signed)
Physical Therapy Treatment Patient Details Name: Kim Wagner MRN: [id number] DOB: Oct 31, 1955 Today's Date: 05/26/2018    History of Present Illness Pt is a 62 y.o. female admitted 05/22/18 with c/o BLE weakness and SOB. Found to be hypoxic requiring 2L O2 [place]. Admitted for presumed new diagnosis of CHF. PMH includes chronic back pain, DDD, tobacco use.     PT Comments    Pt ambulated 150' with RW, distance limited by L hip pain (pt reports h/o spinal stenosis and OA of hip).  Ambulation distance limited by L hip pain.   Follow Up Recommendations  Outpatient PT;Supervision for mobility/OOB     Equipment Recommendations  None recommended by PT    Recommendations for Other Services       Precautions / Restrictions Precautions Precautions: Fall Precaution Comments: 6-7 falls in past 1 year (2* spinal stenosis, L hip "giving out" per pt) Restrictions Weight Bearing Restrictions: No    Mobility  Bed Mobility               General bed mobility comments: Up in recliner upon arrival  Transfers Overall transfer level: Needs assistance Equipment used: Rolling walker (2 wheeled) Transfers: Sit to/from Stand Sit to Stand: Supervision         General transfer comment: supervision for safety  Ambulation/Gait Ambulation/Gait assistance: Supervision Gait Distance (Feet): 150 Feet Assistive device: Rolling walker (2 wheeled) Gait Pattern/deviations: Step-through pattern;Decreased stride length;Trunk flexed Gait velocity: Decreased   General Gait Details: VCs for positioning in RW and for posture, HR 90s walking, no loss of balance, distance limited by L hip pain, SaO2 90% on 2L O2   Stairs             Wheelchair Mobility    Modified Rankin (Stroke Patients Only)       Balance Overall balance assessment: Needs assistance Sitting-balance support: No upper extremity supported;Feet supported Sitting balance-Leahy Scale: Good Sitting balance - Comments: able to  lateral lean for LB dressing   Standing balance support: Single extremity supported Standing balance-Leahy Scale: Fair                              Cognition Arousal/Alertness: Awake/alert Behavior During Therapy: WFL for tasks assessed/performed Overall Cognitive Status: Within Functional Limits for tasks assessed                                        Exercises      General Comments        Pertinent Vitals/Pain Pain Score: 7  Pain Location: Bilateral Hips Pain Descriptors / Indicators: Constant Pain Intervention(s): Limited activity within patient's tolerance;Monitored during session;Patient requesting pain meds-RN notified    Home Living                      Prior Function            PT Goals (current goals can now be found in the care plan section) Acute Rehab PT Goals Patient Stated Goal: to enjoy retirement PT Goal Formulation: With patient Time For Goal Achievement: 06/06/18 Potential to Achieve Goals: Good Progress towards PT goals: Progressing toward goals    Frequency    Min 3X/week      PT Plan Current plan remains appropriate    Co-evaluation  AM-PAC PT "6 Clicks" Daily Activity  Outcome Measure  Difficulty turning over in bed (including adjusting bedclothes, sheets and blankets)?: A Little Difficulty moving from lying on back to sitting on the side of the bed? : A Little Difficulty sitting down on and standing up from a chair with arms (e.g., wheelchair, bedside commode, etc,.)?: A Little Help needed moving to and from a bed to chair (including a wheelchair)?: A Little Help needed walking in hospital room?: A Little Help needed climbing 3-5 steps with a railing? : A Little 6 Click Score: 18    End of Session Equipment Utilized During Treatment: Gait belt;Oxygen Activity Tolerance: Patient tolerated treatment well Patient left: in chair;with call bell/phone within reach;with chair  alarm set Nurse Communication: Mobility status PT Visit Diagnosis: Other abnormalities of gait and mobility (R26.89)     Time: 5790-3833 PT Time Calculation (min) (ACUTE ONLY): 12 min  Charges:  $Gait Training: 8-22 mins                        Ralene Bathe Kistler 05/26/2018, 2:37 PM [phone]

## 2018-05-26 NOTE — Progress Notes (Signed)
Patient need oxygen for home; [facility] with [facility] called, patient does not qualify for their [facility] program and will have to pay $250; CM informed patient and her daughter Marisue Ivan; Marisue Ivan talked to [place] and informed him that she does not have the money to pay for home oxygen; Attending MD called. Abelino Derrick [facility] [phone]

## 2018-05-26 NOTE — Progress Notes (Signed)
PROGRESS NOTE    Kim Wagner  QQP:[id number] DOB: October 07, 1955 DOA: 05/22/2018 PCP: Hadley Pen, MD    Brief Narrative:  Kim Wagner is a 63 y.o. female with medical history significant of chronic back pain, has been having progressive lower extremity edema and shortness of breath has been falling at home . Patient has been having severe hip pain MRI of the back in May showed DDD patient is to follow-up with Dr. Venetia Maxon. She was admitted for hypoxia with sats in 70's, has an extensive smoking history.  She was admitted for evaluation of CHF.   Assessment & Plan:   Active Problems:   Dyspnea   Leg edema   Back pain   Anemia   Hyponatremia   Fluid overload   Dyspnea, hypoxia, leg edema, elevated BNP, fluid overload with hyponatremia.  Admitted for evaluation of CHF.  CT chest does not show any acute abnormality. Echocardiogram ordered showed preserved LVEF, with indeterminate diastolic dysfunction and moderate pulmonary hypertension.  [place] oxygen to keep sats greater than 90%.  Serial troponins negative.  Started on IV lasix 40 mg BID.  Has good urine output.  8.5 L of  diuresis since admission, transitioned to oral lasix today Strict intake and output.  Daily weights.  Replete potassium as needed. Creatinine remains stable on diuresis.  Pt is stable for d/c home with lasix and 2 lit of  oxygen.     Hyponatremia: from fluid overload. Improving.  Sodium at 132.  Improving with diuresis.     Iron deficiency anemia:  Will need iron supplementation on discharge.  Hemoglobin stable at 9.  CT abd and pelvis does not show any acute abnormality.  IV iron infusion ordered and given without any complications.     Confusion, as per the daughter, she has bouts of confusion intermittently,  Initial CT head negative.  tsh wnl.  Low normal B12 levels.  Replete B12, pt will need dementia work up as outpatient.  Currently no focal deficits at this time.     Tobacco  abuse: On nicotine patch.    Hypertension: well controlled.     DVT prophylaxis: lovenox.  Code Status: DNR Family Communication:none at bedside.  Disposition Plan: pending insurance auth for discharge.    Consultants:   None.    Procedures: echocardiogram.    Antimicrobials:none.      Subjective: No chest pain , breathing has improved from yesterday.  She is not back to baseline yet.  Objective: Vitals:   05/25/18 2254 05/26/18 0444 05/26/18 0916 05/26/18 1624  BP: 116/73 112/71 111/70 106/71  Pulse: 77 78 75 80  Resp:  18 19 (!) 21  Temp:  98.4 F (36.9 C) 98.1 F (36.7 C) 98.3 F (36.8 C)  TempSrc:  Oral Oral Oral  SpO2:  91% 92% 94%  Weight:  57.6 kg    Height:        Intake/Output Summary (Last 24 hours) at 05/26/2018 1723 Last data filed at 05/26/2018 1326 Gross per 24 hour  Intake 989.6 ml  Output 3650 ml  Net -2660.4 ml   Filed Weights   05/24/18 0252 05/25/18 0458 05/26/18 0444  Weight: 59.6 kg 59 kg 57.6 kg    Examination:  General exam: Appears calm and comfortable on 2 L of nasal cannula oxygen Respiratory system: diminished air entry at bases. No wheezing or rhonchi.  Cardiovascular system: S1 & S2 heard, RRR. No JVD, murmurs, Gastrointestinal system: Abdomen is soft, NT ND BS+ Central nervous system: Alert  and oriented. No focal neurological deficits. Extremities: 2+ leg edema. Skin: No rashes, lesions or ulcers Psychiatry:  Mood & affect appropriate.     Data Reviewed: I have personally reviewed following labs and imaging studies  CBC: Recent Labs  Lab 05/22/18 1800 05/22/18 2137 05/23/18 0827  WBC 10.4 10.7* 8.3  NEUTROABS  --  7.0 5.4  HGB 9.3* 9.7* 9.5*  HCT 31.7* 33.3* 32.7*  MCV 76.0* 76.2* 75.7*  PLT 358 363 346   Basic Metabolic Panel: Recent Labs  Lab 05/22/18 1800 05/23/18 0827 05/24/18 1103 05/25/18 1009  NA 127* 130* 129* 132*  K 3.8 3.5 3.5 4.2  CL 87* 85* 81* 83*  CO2 31 37* 42* 43*  GLUCOSE 95  100* 118* 103*  BUN 9 <5* 7* 6*  CREATININE 0.46 0.45 0.53 0.62  CALCIUM 8.6* 8.8* 8.8* 9.2  MG  --  1.7  --   --   PHOS  --  3.7  --   --    GFR: Estimated Creatinine Clearance: 61.3 mL/min (by C-G formula based on SCr of 0.62 mg/dL). Liver Function Tests: Recent Labs  Lab 05/22/18 1800 05/23/18 0827  AST 14* 15  ALT 11 12  ALKPHOS 159* 158*  BILITOT 0.6 0.7  PROT 5.4* 5.7*  ALBUMIN 3.0* 2.9*   No results for input(s): LIPASE, AMYLASE in the last 168 hours. Recent Labs  Lab 05/23/18 0315  AMMONIA 34   Coagulation Profile: Recent Labs  Lab 05/23/18 0315  INR 1.04   Cardiac Enzymes: Recent Labs  Lab 05/22/18 1800 05/22/18 2137 05/23/18 0315 05/23/18 0827  TROPONINI <0.03 <0.03 <0.03 <0.03   BNP (last 3 results) No results for input(s): PROBNP in the last 8760 hours. HbA1C: No results for input(s): HGBA1C in the last 72 hours. CBG: No results for input(s): GLUCAP in the last 168 hours. Lipid Profile: No results for input(s): CHOL, HDL, LDLCALC, TRIG, CHOLHDL, LDLDIRECT in the last 72 hours. Thyroid Function Tests: No results for input(s): TSH, T4TOTAL, FREET4, T3FREE, THYROIDAB in the last 72 hours. Anemia Panel: No results for input(s): VITAMINB12, FOLATE, FERRITIN, TIBC, IRON, RETICCTPCT in the last 72 hours. Sepsis Labs: No results for input(s): PROCALCITON, LATICACIDVEN in the last 168 hours.  No results found for this or any previous visit (from the past 240 hour(s)).       Radiology Studies: No results found.      Scheduled Meds: . aspirin EC  81 mg Oral Daily  . feeding supplement (ENSURE ENLIVE)  237 mL Oral BID BM  . ferrous sulfate  325 mg Oral BID WC  . furosemide  40 mg Oral Daily  . mouth rinse  15 mL Mouth Rinse BID  . nicotine  21 mg Transdermal Daily  . polyethylene glycol  17 g Oral Daily  . ramipril  1.25 mg Oral BID  . senna-docusate  2 tablet Oral BID  . sodium chloride flush  3 mL Intravenous Q12H  . thiamine  100 mg  Oral Daily  . vitamin B-12  500 mcg Oral Daily   Continuous Infusions: . sodium chloride Stopped (05/25/18 1846)  . sodium chloride       LOS: 3 days    Time spent: 35 minutes.     Kathlen Mody, MD [facility] Pager [phone]   If 7PM-7AM, please contact night-coverage [email or web address] Password [facility] 05/26/2018, 5:23 PM

## 2018-05-26 NOTE — Progress Notes (Addendum)
Please see full CM consult from 05/25/2018 Patient requested that CM talk to her daughter concerning [facility] choices; TCT Marisue Ivan ( [phone]); Marisue Ivan requested [facility]; Dan with [facility] called for arrangements; Jermane with [facility] called for home oxygen to be delivered to the room today prior to discharging home; Alexis Goodell [phone]  12:21 pm - Shower chair ordered as requested through [facility]; Abelino Derrick RN,MHA,BSN

## 2018-05-26 NOTE — Progress Notes (Signed)
Occupational Therapy Treatment Patient Details Name: Kim Wagner MRN: [id number] DOB: Jun 11, 1956 Today's Date: 05/26/2018    History of present illness Pt is a 62 y.o. female admitted 05/22/18 with c/o BLE weakness and SOB. Found to be hypoxic requiring 2L O2 [place]. Admitted for presumed new diagnosis of CHF. PMH includes chronic back pain, DDD, tobacco use.    OT comments  Pt progressing towards established OT goals. Pt performing grooming at sink with supervision for safety. Pt performing hallway distance functional mobility with RW and supervision. Pt Spo2 fluctuating between 93%-87% on 2-3L O2. VCs for purse lip breathing. Reviewed EC strategies. Continue to recommend dc home once medically stable and will continue to follow acutely as admitted.    Follow Up Recommendations  Supervision - Intermittent;Home health OT    Equipment Recommendations  Tub/shower seat    Recommendations for Other Services      Precautions / Restrictions Precautions Precautions: Fall Precaution Comments: increased falls leading up to admission Restrictions Weight Bearing Restrictions: No       Mobility Bed Mobility               General bed mobility comments: Up in recliner upon arrival  Transfers Overall transfer level: Needs assistance Equipment used: Rolling walker (2 wheeled) Transfers: Sit to/from Stand Sit to Stand: Supervision         General transfer comment: supervision for safety    Balance Overall balance assessment: Needs assistance Sitting-balance support: No upper extremity supported;Feet supported Sitting balance-Leahy Scale: Good Sitting balance - Comments: able to lateral lean for LB dressing   Standing balance support: Single extremity supported Standing balance-Leahy Scale: Fair Standing balance comment: pt able to complete std pvt transfer to chair with min guard without AD                           ADL either performed or assessed with clinical  judgement   ADL Overall ADL's : Needs assistance/impaired     Grooming: Standing;Oral care;Supervision/safety Grooming Details (indicate cue type and reason): supervision for safety         Upper Body Dressing : Set up;Supervision/safety;Standing Upper Body Dressing Details (indicate cue type and reason): donning/doffing second gown like jacket Lower Body Dressing: Set up;Sit to/from stand;Supervision/safety Lower Body Dressing Details (indicate cue type and reason): Pt adjusting socks with out difficulty. supervision for safety in standing Toilet Transfer: Set up;Supervision/safety;Ambulation;RW(Simulated to recliner after funcitonal mobility in hallway)           Functional mobility during ADLs: Rolling walker;Supervision/safety;Set up General ADL Comments: Pt performing grooming and hallway distance functional mobility with supervision for safety. Pt SpO2 flucutating between 87%-93% on 2-3L. Agreeable to participate in therapy and motivated to return home.     Vision   Vision Assessment?: No apparent visual deficits   Perception     Praxis      Cognition Arousal/Alertness: Awake/alert Behavior During Therapy: WFL for tasks assessed/performed Overall Cognitive Status: Within Functional Limits for tasks assessed Area of Impairment: Problem solving                             Problem Solving: Slow processing;Requires verbal cues General Comments: Pt able to recall prior session with OT for EC. Pt benefits from increased time for processing. Feel pt is near baseline cognition        Exercises     Shoulder Instructions  General Comments SpO2 87-93% on2-3 L; Rn notified    Pertinent Vitals/ Pain       Pain Assessment: Faces Faces Pain Scale: Hurts a little bit Pain Location: Bilateral Hips Pain Descriptors / Indicators: Constant Pain Intervention(s): Monitored during session;Limited activity within patient's tolerance;Repositioned  Home Living                                           Prior Functioning/Environment              Frequency  Min 2X/week        Progress Toward Goals  OT Goals(current goals can now be found in the care plan section)  Progress towards OT goals: Progressing toward goals  Acute Rehab OT Goals Patient Stated Goal: to enjoy retirement OT Goal Formulation: With patient Time For Goal Achievement: 06/06/18 Potential to Achieve Goals: Good ADL Goals Pt Will Perform Grooming: with modified independence;standing Pt Will Transfer to Toilet: with modified independence;ambulating Pt Will Perform Toileting - Clothing Manipulation and hygiene: with modified independence;sit to/from stand Additional ADL Goal #1: Pt will recall 3 ways of conserving energy during ADL/IADL at independent level  Plan Discharge plan remains appropriate    Co-evaluation                 AM-PAC PT "6 Clicks" Daily Activity     Outcome Measure   Help from another person eating meals?: None Help from another person taking care of personal grooming?: A Little Help from another person toileting, which includes using toliet, bedpan, or urinal?: A Little Help from another person bathing (including washing, rinsing, drying)?: A Little Help from another person to put on and taking off regular upper body clothing?: None Help from another person to put on and taking off regular lower body clothing?: A Little 6 Click Score: 20    End of Session Equipment Utilized During Treatment: Rolling walker;Oxygen(2-#L)  OT Visit Diagnosis: Unsteadiness on feet (R26.81);Muscle weakness (generalized) (M62.81)   Activity Tolerance Patient tolerated treatment well   Patient Left in chair;with call bell/phone within reach;with chair alarm set   Nurse Communication Mobility status        Time: 0623-7628 OT Time Calculation (min): 21 min  Charges: OT General Charges $OT Visit: 1 Visit OT Treatments $Self  Care/Home Management : 8-22 mins  Zade Falkner MSOT, OTR/L Acute Rehab Pager: [phone] Office: [phone]   Kim Wagner 05/26/2018, 11:27 AM

## 2018-05-26 NOTE — Progress Notes (Signed)
Chaplain presented to the patient, made Chaplain introduction, and inquired as to whether the patient has completed HCPOA prior to this admission. The patient reports she is to be discharged on today, however Chaplain informed the patient she can schedule an appointment with the Spiritual Care to assist  in completing if she would like. She stated her daughter has requested she have one completed, so she says she will follow thru on it. Chaplain offered of prayer of continue healing for the patient. Chaplain Janell Quiet [phone]

## 2018-05-27 DIAGNOSIS — I5033 Acute on chronic diastolic (congestive) heart failure: Secondary | ICD-10-CM

## 2018-05-27 DIAGNOSIS — D509 Iron deficiency anemia, unspecified: Secondary | ICD-10-CM

## 2018-05-27 DIAGNOSIS — J9601 Acute respiratory failure with hypoxia: Secondary | ICD-10-CM

## 2018-05-27 DIAGNOSIS — E871 Hypo-osmolality and hyponatremia: Secondary | ICD-10-CM

## 2018-05-27 LAB — BASIC METABOLIC PANEL
ANION GAP: 8 (ref 5–15)
BUN: 15 mg/dL (ref 8–23)
CALCIUM: 9.5 mg/dL (ref 8.9–10.3)
CO2: 38 mmol/L — ABNORMAL HIGH (ref 22–32)
CREATININE: 0.61 mg/dL (ref 0.44–1.00)
Chloride: 85 mmol/L — ABNORMAL LOW (ref 98–111)
GFR calc Af Amer: >60 mL/min (ref >60)
GFR calc non Af Amer: >60 mL/min (ref >60)
Glucose, Bld: 78 mg/dL (ref 70–99)
Potassium: 4.3 mmol/L (ref 3.5–5.1)
Sodium: 131 mmol/L — ABNORMAL LOW (ref 135–145)

## 2018-05-27 LAB — GLUCOSE, CAPILLARY: Glucose-Capillary: 247 mg/dL — ABNORMAL HIGH (ref 70–99)

## 2018-05-27 MED ORDER — ADULT MULTIVITAMIN W/MINERALS CH
1.0000 | ORAL_TABLET | Freq: Every day | ORAL | Status: DC
  Administered 2018-05-27: 1 via ORAL
  Filled 2018-05-27: qty 1

## 2018-05-27 MED ORDER — ENSURE ENLIVE PO LIQD: 237.0000 mL | Freq: Every day | ORAL | Status: DC

## 2018-05-27 NOTE — Discharge Instructions (Signed)

## 2018-05-27 NOTE — Progress Notes (Signed)
Discharged orders discussed with pt. And daughter. Pt. C/o of hip pain and also Requesting pain medication for home. PRN pain medication given. On call for [facility] paged to make aware. Pt. And daughter instructed to call back in the am per on call for [facility].

## 2018-05-27 NOTE — Progress Notes (Signed)
Nutrition Follow-up  DOCUMENTATION CODES:   Not applicable  INTERVENTION:   -Decrease Ensure Enlive po daily, each supplement provides 350 kcal and 20 grams of protein -MVI with minerals daily  NUTRITION DIAGNOSIS:   Increased nutrient needs related to acute illness as evidenced by estimated needs.  Ongoing  GOAL:   Patient will meet greater than or equal to 90% of their needs  Progressing   MONITOR:   PO intake, Supplement acceptance, Labs, Weight trends, Diet advancement, I & O's  REASON FOR ASSESSMENT:   Consult Assessment of nutrition requirement/status  ASSESSMENT:   62 y/o female PMHx Chronic back pain, extensive tobacco abuse. Presented after could not get up off of swing set due to weakness. Has had SOB x 1 month. Workup revealed BLE edema, Bilateral pleural effusions and elevated BNP. Admitted for suspected new HF  Reviewed I/O's: -895 ml x 24 hours and -8.3 L since admission  Pt receiving nursing care at time of visit.   Pt's appetite has improved; meal completion 100%. Pt is also consuming Ensure supplements as prescribed.   Per RNCM note, pt will be discharged once home oxygen is arranged.   Labs reviewed: Na: 131.   Diet Order:   Diet Order            Diet - low sodium heart healthy        Diet Heart Room service appropriate? Yes; Fluid consistency: Thin  Diet effective now              EDUCATION NEEDS:   Not appropriate for education at this time  Skin:  Skin Assessment: Reviewed RN Assessment  Last BM:  05/26/18  Height:   Ht Readings from Last 1 Encounters:  05/22/18 5\' 4"  (1.626 m)    Weight:   Wt Readings from Last 1 Encounters:  05/27/18 58.3 kg    Ideal Body Weight:  54.54 kg  BMI:  Body mass index is 22.06 kg/m.  Estimated Nutritional Needs:   Kcal:  1900-2050 kcals (30-33 kcal/kg bw)  Protein:  85-100g Pro (1.4-1.6 g/kg bw)  Fluid:  Per MD fluid goals    Kalep Full A. Mayford Knife, RD, LDN, CDE Pager:  [phone] After hours Pager: [phone]

## 2018-05-27 NOTE — Progress Notes (Addendum)
9:50 am -CM talked to patient at the bedside concerning home oxygen; patient stated that her daughter was trying to work out some things on how to get it. CM will follow up with pt; B Shelba Flake [phone]  11:45 am- Patient stated that she is trying to reach her brother to go to her home to get her purse so that she can write a check to [facility] for her home oxygen. Abelino Derrick RN  2:19 pm- 02 sat qualifications repeated, pt does not qualify for home oxygen at this time; Attending MD texted. B Kyston Gonce RN,MHA,bsn

## 2018-05-27 NOTE — Discharge Summary (Signed)
Physician Discharge Summary  Kim Wagner ZOX:[id number] DOB: 12/08/1955  PCP: Hadley Pen, MD  Admit date: 05/22/2018 Discharge date: 05/27/2018  Recommendations for Outpatient Follow-up:  1. Dr. Keturah Barre, PCP in 1 week with repeat labs (CBC & BMP). 2. [facility] in 1 week. 3. Consider outpatient Urology consultation: Please see discussion below.  Home Health: PT, OT, RN and aide Equipment/Devices: Shower chair.  Discharge Condition: Improved and stable. CODE STATUS: DNR. Diet recommendation: Heart healthy diet.  Discharge Diagnoses:  Active Problems:   Dyspnea   Leg edema   Back pain   Anemia   Hyponatremia   Fluid overload   Brief Summary: 62 year old female patient with history of chronic back pain, presented to the ED due to progressive lower extremity edema, dyspnea, weakness and falls at home.  On day of admission, patient apparently could not get up from the swing while swinging her grandson in the park.  Patient has been having severe hip pain.  MRI of the back in May showed DDD and patient is to follow-up with Dr. Venetia Maxon, Neurosurgery.  She continues to smoke and has chronic cough.  As per family, confused at times.  In the ED, she was noted to be hypoxic in the 70s.  She was admitted for evaluation of CHF.  Assessment and plan:  1. Acute on chronic diastolic CHF: Patient presented with dyspnea, lower extremity edema.  She was noted to be hypoxic with fluid overload, hyponatremia and elevated BNP.  CT chest abdomen and pelvis did not show acute abnormalities.  TTE: LVEF 65-70%, indeterminate diastolic function.  Troponins were cycled and negative.  She was treated with IV Lasix 40 mg twice daily with good effect.  She was -8.2 L since admission.  She was transitioned to oral Lasix and supposed to discharge on 9/2 but discharge was held due to inability to arrange home oxygen. 2. Acute respiratory failure with hypoxia: Secondary to decompensated CHF.  On day  of discharge, reassessment showed that hypoxia had resolved. 3. Hyponatremia: Suspected due to volume overload: Remained stable. 4. Iron deficiency anemia: Status post IV iron.  Continue iron supplements at discharge.  Hemoglobin stable in the mid 9 g range.  Outpatient evaluation as deemed necessary/GI screening if not already performed.  CT abdomen and pelvis did not show any acute abnormality.  Anemia panel: Iron 17, TIBC 465, saturation ratio 4, ferritin 9, folate 26 and B12: 195. 5. Confusion: As per family, she had bouts of confusion intermittently.  CT head negative.  TSH normal.  Low normal B12 levels.  Started B12 supplements.  Continue thiamine supplements.  Dementia work-up as outpatient. 6. Tobacco abuse: Cessation counseled.  Nicotine patch. 7. Essential hypertension: Controlled.  Continue ramipril. 8. 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 9. Back pain/DDD: Outpatient follow-up with Neurosurgery as per previous plans. 10. Significant weight loss: Unclear etiology.  Outpatient evaluation.   Consultations:  None  Procedures:  TTE   Discharge Instructions  Discharge Instructions    (HEART FAILURE PATIENTS) Call MD:  Anytime you have any of the following symptoms: 1) 3 pound weight gain in 24 hours or 5 pounds in 1 week 2) shortness of breath, with or without a dry hacking cough 3) swelling in the  hands, feet or stomach 4) if you have to sleep on extra pillows at night in order to breathe.   Complete by:  As directed    Call MD for:  difficulty breathing, headache or visual disturbances   Complete by:  As  directed    Call MD for:  extreme fatigue   Complete by:  As directed    Call MD for:  persistant dizziness or light-headedness   Complete by:  As directed    Call MD for:  severe uncontrolled pain   Complete by:  As directed    Diet - low sodium heart healthy   Complete by:  As directed    Discharge instructions   Complete by:  As directed    Please follow up with PCP in one week.  Please follow up with cardiology as recommended in 2 to 4 weeks.  Please follow up with Neurology for dementia work up.   Increase activity slowly   Complete by:  As directed        Medication List    STOP taking these medications   ciprofloxacin 500 MG tablet Commonly known as:  CIPRO   oxyCODONE-acetaminophen 5-325 MG tablet Commonly known as:  PERCOCET/ROXICET   senna-docusate 8.6-50 MG tablet Commonly known as:  Senokot-S     TAKE these medications   aspirin EC 81 MG tablet Take 81 mg by mouth at bedtime.   CENTRUM WOMEN Tabs Take 1 tablet by mouth daily.   feeding supplement (ENSURE ENLIVE) Liqd Take 237 mLs by mouth 2 (two) times daily between meals.   ferrous sulfate 325 (65 FE) MG tablet Take 1 tablet (325 mg total) by mouth 2 (two) times daily with a meal.   furosemide 40 MG tablet Commonly known as:  LASIX Take 1 tablet (40 mg total) by mouth daily.   gabapentin 300 MG capsule Commonly known as:  NEURONTIN Take 300 mg by mouth at bedtime as needed (for nerve pain).   nicotine 21 mg/24hr patch Commonly known as:  NICODERM CQ - dosed in mg/24 hours Place 1 patch (21 mg total) onto the skin daily for 7 days.   ramipril 1.25 MG capsule Commonly known as:  ALTACE Take 1 capsule (1.25 mg total) by mouth 2 (two) times daily.   thiamine 100 MG tablet Take 1 tablet (100 mg total) by mouth daily.   vitamin B-12 500 MCG tablet Commonly known as:  CYANOCOBALAMIN Take 1 tablet (500 mcg total) by mouth daily.      Follow-up Information    [facility] [employer].  Go on 06/29/2018.   Specialty:  Cardiology Why:  for diastolic heart failure. @ 10:00am please arrive @9 :45 am Contact information: [address], [address] [place] [address] [phone]       [facility]. Schedule an appointment as soon as possible for a visit in 1 week(s).   Contact information: [address] [place], [address] [place] [address] [phone]       Hadley Pen, MD. Schedule an appointment as soon as possible for a visit in 1 week(s).   Specialty:  Family Medicine Why:  To be seen with repeat labs (CBC & BMP). Contact information: [address] [place] [address] [phone]          Allergies  Allergen Reactions  . Penicillins Rash    Has patient had a PCN reaction causing immediate rash, facial/tongue/throat swelling, SOB or lightheadedness with hypotension: Yes Has  patient had a PCN reaction causing severe rash involving mucus membranes or skin necrosis: Unk Has patient had a PCN reaction that required hospitalization: No Has patient had a PCN reaction occurring within the last 10 years: No If all of the above answers are "NO", then may proceed with Cephalosporin use.       Procedures/Studies: Dg Chest 2 View  Result Date: 05/22/2018 CLINICAL DATA:  62 year old female with a history of weakness and shortness of breath EXAM: CHEST - 2 VIEW COMPARISON:  01/02/2018 FINDINGS: Cardiomediastinal silhouette unchanged in size and contour. No evidence of central vascular congestion. Patchy opacities at the lung bases with associated small pleural effusions. Chronic coarsening of interstitial markings. No pneumothorax. Thoracic spine degenerative changes.  No acute displaced fracture. IMPRESSION: Bilateral small pleural effusions with associated atelectasis/consolidation. If there is concern for pneumonia, recommend correlation with lab values. Multilevel degenerative changes of the spine. Electronically  Signed   By: Gilmer Mor D.O.   On: 05/22/2018 17:27   Ct Head Wo Contrast  Result Date: 05/23/2018 CLINICAL DATA:  Patient with weakness. EXAM: CT HEAD WITHOUT CONTRAST TECHNIQUE: Contiguous axial images were obtained from the base of the skull through the vertex without intravenous contrast. COMPARISON:  Brain CT 12/06/2017 FINDINGS: Brain: Ventricles and sulci are appropriate for patient's age. No evidence for acute cortically based infarct, intracranial hemorrhage, mass lesion or mass-effect. Periventricular and subcortical white matter hypodensity compatible with chronic microvascular ischemic changes. Vascular: Internal carotid arterial vascular calcifications. Skull: Intact. Sinuses/Orbits: Paranasal sinuses are well aerated. Mastoid air cells unremarkable. Orbits are unremarkable. Other: None. IMPRESSION: No acute intracranial process. Atrophy and chronic microvascular ischemic changes. Electronically Signed   By: Annia Belt M.D.   On: 05/23/2018 01:45   Ct Chest W Contrast  Result Date: 05/23/2018 CLINICAL DATA:  62 year old female with significant unintentional weight loss and decreased appetite EXAM: CT CHEST, ABDOMEN, AND PELVIS WITH CONTRAST TECHNIQUE: Multidetector CT imaging of the chest, abdomen and pelvis was performed following the standard protocol during bolus administration of intravenous contrast. CONTRAST:  ISOVUE-300 IOPAMIDOL (ISOVUE-300) INJECTION 61% COMPARISON:  Prior CT scan of the chest 01/01/2018 FINDINGS: CT CHEST FINDINGS Cardiovascular: Conventional 3 vessel arch anatomy. Elongation of the aortic infundibulum and descending thoracic aorta consistent with a type 3 arch. Cardiac motion limits ability to precisely measure the ascending thoracic aorta. There has been no interval enlargement compared to the relatively recent prior imaging. The maximal diameter is no greater than 4 cm and is suspected to be less. Normal caliber main and central pulmonary arteries. No  evidence of central pulmonary embolus. Calcifications present along the left anterior descending coronary artery. Mild cardiomegaly with left heart enlargement. No pericardial effusion. Mediastinum/Nodes: Unremarkable CT appearance of the thyroid gland. No suspicious mediastinal or hilar adenopathy. No soft tissue mediastinal mass. The thoracic esophagus is unremarkable. Lungs/Pleura: Moderate respiratory motion artifact. Atelectasis is present in both lower lobes. No evidence of pulmonary edema, pleural effusion or pneumothorax. No focal airspace consolidation. No suspicious pulmonary nodule or mass. Musculoskeletal: No acute fracture or aggressive appearing lytic or blastic osseous lesion. CT ABDOMEN PELVIS FINDINGS Hepatobiliary: Normal hepatic contour and morphology. No discrete hepatic lesions. Normal appearance of the gallbladder. No intra or extrahepatic biliary ductal dilatation. Pancreas: Unremarkable. No pancreatic ductal dilatation or surrounding inflammatory changes. Spleen: Normal in size without focal abnormality. Adrenals/Urinary Tract: Unremarkable adrenal glands. Calcification of the renal pyramids in both the upper and lower pole of the left kidney. There is mild left hydroureteronephrosis. The  ureter is mildly dilated to the level of the UVJ. No obstructing lesion or stone identified. On the right, there is no hydronephrosis or nephrocalcinosis. Stomach/Bowel: Colonic diverticular disease without CT evidence of active inflammation. No focal bowel wall thickening or evidence of obstruction. Vascular/Lymphatic: Atherosclerotic calcifications present throughout the abdominal aorta. No evidence of aneurysm. The SMA, celiac artery and IMA all remain patent without evidence to suggest high-grade stenosis. Reproductive: Surgical changes of prior hysterectomy. Mild pelvic floor laxity. Other: Moderately large right femoral hernia containing multiple loops of small bowel, mesentery and omental fat.  Musculoskeletal: No acute fracture or aggressive appearing lytic or blastic osseous lesion. Mild levoconvex scoliosis centered at L1. exaggerated thoracic kyphosis. Limbus vertebra at L3. Mild grade 1 anterolisthesis of L4 on L5. IMPRESSION: 1. No acute abnormality within the chest abdomen or pelvis. 2. While there is mild aortic atherosclerosis, the visceral arteries all appear patent without evidence of significant stenosis or occlusion to suggest a source for chronic mesenteric ischemia. Aortic Atherosclerosis (ICD10-170.0) 3. 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 4. Mild left hydroureteronephrosis without evidence of distal ureteral stone, stenosis or lesion. This may be secondary to reflux, or a bladder lesion in the region of the UVJ smaller than the resolution of CT. Consider correlation with urine cytology and potentially cystoscopy. 5. 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 6. Coronary artery calcifications. 7. Mild cardiomegaly. 8. Moderately large right femoral hernia containing multiple loops of small bowel, mesentery and omental fat. 9. Colonic diverticular disease without CT evidence of active inflammation. 10. Pelvic floor laxity. Electronically Signed   By: Malachy Moan M.D.   On: 05/23/2018 12:09   Ct Abdomen Pelvis W Contrast  Result Date: 05/23/2018 CLINICAL DATA:  62 year old female with significant unintentional weight loss and decreased appetite EXAM: CT CHEST, ABDOMEN, AND PELVIS WITH CONTRAST TECHNIQUE: Multidetector CT imaging of the chest, abdomen and pelvis was performed following the standard protocol during bolus administration of intravenous contrast. CONTRAST:  ISOVUE-300 IOPAMIDOL (ISOVUE-300) INJECTION 61% COMPARISON:  Prior CT scan of the chest 01/01/2018 FINDINGS: CT CHEST FINDINGS Cardiovascular: Conventional 3 vessel arch anatomy.  Elongation of the aortic infundibulum and descending thoracic aorta consistent with a type 3 arch. Cardiac motion limits ability to precisely measure the ascending thoracic aorta. There has been no interval enlargement compared to the relatively recent prior imaging. The maximal diameter is no greater than 4 cm and is suspected to be less. Normal caliber main and central pulmonary arteries. No evidence of central pulmonary embolus. Calcifications present along the left anterior descending coronary artery. Mild cardiomegaly with left heart enlargement. No pericardial effusion. Mediastinum/Nodes: Unremarkable CT appearance of the thyroid gland. No suspicious mediastinal or hilar adenopathy. No soft tissue mediastinal mass. The thoracic esophagus is unremarkable. Lungs/Pleura: Moderate respiratory motion artifact. Atelectasis is present in both lower lobes. No evidence of pulmonary edema, pleural effusion or pneumothorax. No focal airspace consolidation. No suspicious pulmonary nodule or mass. Musculoskeletal: No acute fracture or aggressive appearing lytic or blastic osseous lesion. CT ABDOMEN PELVIS FINDINGS Hepatobiliary: Normal hepatic contour and morphology. No discrete hepatic lesions. Normal appearance of the gallbladder. No intra or extrahepatic biliary ductal dilatation. Pancreas: Unremarkable. No pancreatic ductal dilatation or surrounding inflammatory changes. Spleen: Normal in size without focal abnormality. Adrenals/Urinary Tract: Unremarkable adrenal glands. Calcification of the renal pyramids in both the upper and lower pole of the left kidney. There is mild left hydroureteronephrosis. The ureter  is mildly dilated to the level of the UVJ. No obstructing lesion or stone identified. On the right, there is no hydronephrosis or nephrocalcinosis. Stomach/Bowel: Colonic diverticular disease without CT evidence of active inflammation. No focal bowel wall thickening or evidence of obstruction.  Vascular/Lymphatic: Atherosclerotic calcifications present throughout the abdominal aorta. No evidence of aneurysm. The SMA, celiac artery and IMA all remain patent without evidence to suggest high-grade stenosis. Reproductive: Surgical changes of prior hysterectomy. Mild pelvic floor laxity. Other: Moderately large right femoral hernia containing multiple loops of small bowel, mesentery and omental fat. Musculoskeletal: No acute fracture or aggressive appearing lytic or blastic osseous lesion. Mild levoconvex scoliosis centered at L1. exaggerated thoracic kyphosis. Limbus vertebra at L3. Mild grade 1 anterolisthesis of L4 on L5. IMPRESSION: 1. No acute abnormality within the chest abdomen or pelvis. 2. While there is mild aortic atherosclerosis, the visceral arteries all appear patent without evidence of significant stenosis or occlusion to suggest a source for chronic mesenteric ischemia. Aortic Atherosclerosis (ICD10-170.0) 3. 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 4. Mild left hydroureteronephrosis without evidence of distal ureteral stone, stenosis or lesion. This may be secondary to reflux, or a bladder lesion in the region of the UVJ smaller than the resolution of CT. Consider correlation with urine cytology and potentially cystoscopy. 5. 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 6. Coronary artery calcifications. 7. Mild cardiomegaly. 8. Moderately large right femoral hernia containing multiple loops of small bowel, mesentery and omental fat. 9. Colonic diverticular disease without CT evidence of active inflammation. 10. Pelvic floor laxity. Electronically Signed   By: Malachy Moan M.D.   On: 05/23/2018 12:09      Subjective: Patient denied complaints.  No dyspnea, chest pain or pain elsewhere reported.  Denies dizziness or lightheadedness.  As per RN, no acute issues  reported.  Discharge Exam:  Vitals:   05/27/18 0320 05/27/18 7026 05/27/18 0934 05/27/18 1237  BP:  109/73 111/70 127/79  Pulse:  74 70 70  Resp:  16  18  Temp:  97.6 F (36.4 C)  98.2 F (36.8 C)  TempSrc:  Oral  Oral  SpO2:  (!) 75%  100%  Weight: 58.3 kg     Height:        General: Pleasant middle-aged female, small built, thinly nourished, frail sitting up comfortably in chair this morning. Cardiovascular: S1 & S2 heard, RRR. No murmurs, rubs, gallops or clicks. No JVD or pedal edema. Respiratory: Slightly diminished breath sounds in the bases but no wheezing, rhonchi or crackles.  Rest of lung fields clear to auscultation. No increased work of breathing. Abdominal:  Non distended, non tender & soft. No organomegaly or masses appreciated. Normal bowel sounds heard. CNS: Alert and oriented. No focal deficits. Extremities: no edema, no cyanosis    The results of significant diagnostics from this hospitalization (including imaging, microbiology, ancillary and laboratory) are listed below for reference.      Labs: CBC: Recent Labs  Lab 05/22/18 1800 05/22/18 2137 05/23/18 0827  WBC 10.4 10.7* 8.3  NEUTROABS  --  7.0 5.4  HGB 9.3* 9.7* 9.5*  HCT 31.7* 33.3* 32.7*  MCV 76.0* 76.2* 75.7*  PLT 358 363 346   Basic Metabolic Panel: Recent Labs  Lab 05/22/18 1800 05/23/18 0827 05/24/18 1103 05/25/18 1009 05/27/18 0438  NA 127* 130* 129* 132* 131*  K 3.8 3.5 3.5 4.2 4.3  CL 87* 85* 81* 83* 85*  CO2 31 37* 42* 43* 38*  GLUCOSE 95 100* 118* 103* 78  BUN 9 <5* 7* 6* 15  CREATININE 0.46 0.45 0.53 0.62 0.61  CALCIUM 8.6* 8.8* 8.8* 9.2 9.5  MG  --  1.7  --   --   --   PHOS  --  3.7  --   --   --    Liver Function Tests: Recent Labs  Lab 05/22/18 1800 05/23/18 0827  AST 14* 15  ALT 11 12  ALKPHOS 159* 158*  BILITOT 0.6 0.7  PROT 5.4* 5.7*  ALBUMIN 3.0* 2.9*   BNP (last 3 results) Recent Labs    05/22/18 1800  BNP 353.5*   Cardiac Enzymes: Recent  Labs  Lab 05/22/18 1800 05/22/18 2137 05/23/18 0315 05/23/18 0827  TROPONINI <0.03 <0.03 <0.03 <0.03   CBG: Recent Labs  Lab 05/27/18 0905  GLUCAP 247*   Urinalysis    Component Value Date/Time   COLORURINE YELLOW 05/22/2018 2137   APPEARANCEUR HAZY (A) 05/22/2018 2137   LABSPEC 1.015 05/22/2018 2137   PHURINE 6.0 05/22/2018 2137   GLUCOSEU NEGATIVE 05/22/2018 2137   HGBUR NEGATIVE 05/22/2018 2137   BILIRUBINUR NEGATIVE 05/22/2018 2137   KETONESUR NEGATIVE 05/22/2018 2137   PROTEINUR NEGATIVE 05/22/2018 2137   UROBILINOGEN 1.0 04/18/2013 1855   NITRITE NEGATIVE 05/22/2018 2137   LEUKOCYTESUR NEGATIVE 05/22/2018 2137      Time coordinating discharge: 40 minutes  SIGNED:  Marcellus Scott, MD, FACP, [facility]. [facility] Pager [phone]  If 7PM-7AM, please contact night-coverage [email or web address] Password [email or web address] 05/27/2018, 3:25 PM

## 2018-05-27 NOTE — Progress Notes (Signed)
SATURATION QUALIFICATIONS: (This note is used to comply with regulatory documentation for home oxygen) ° °Patient Saturations on Room Air at Rest = 98% ° °Patient Saturations on Room Air while Ambulating = 97% ° ° ° °Please briefly explain why patient needs home oxygen: °

## 2018-06-28 NOTE — Progress Notes (Deleted)
Cardiology Office Note:    Date:  06/28/2018   ID:  Bambie Pizzolato, DOB 1956-04-13, MRN [id number]  PCP:  Hadley Pen, MD  Cardiologist:  No primary care provider on file.   Referring MD: Hadley Pen, MD   No chief complaint on file. ***  History of Present Illness:    Kim Wagner is a 62 y.o. female with a hx of ***  Past Medical History:  Diagnosis Date  . Chest discomfort    ONLY "IF RUNNING TO PLAY WITH GRANDCHILDREN" - PT IS A SMOKER AND HAS STRONG FAMILY HX OF HEART PROBLEMS AND WILL HAVE NUCLEAR STRESS TEST TUESDAY 06/01/13 FOR CLEARANCE FOR PLANNED URETEROSCOPY, LASER LITHO SURGERY ON 9/15.  Marland Kitchen History of kidney stones     Past Surgical History:  Procedure Laterality Date  . ABDOMINAL HYSTERECTOMY    . CYSTOSCOPY W/ RETROGRADES Left 04/18/2013   Procedure: CYSTOSCOPY WITH LEFT RETROGRADE PYELOGRAM; URETER STENT PLACEMENT;  Surgeon: Milford Cage, MD;  Location: [facility] OR;  Service: Urology;  Laterality: Left;  . CYSTOSCOPY WITH RETROGRADE PYELOGRAM, URETEROSCOPY AND STENT PLACEMENT Left 06/07/2013   Procedure: CYSTOSCOPY, BILATERAL RETROGRADE PYELOGRAM, LEFT URETEROSCOPY, LEFT LASER LITHOTRIPSY, LEFT URETER STENT EXCHANGE;  Surgeon: Milford Cage, MD;  Location: [facility] ORS;  Service: Urology;  Laterality: Left;  CYSTOSCOPY, BILATERAL RETROGRADE PYELOGRAM, LEFT URETEROSCOPY, LEFT LASER LITHOTRIPSY, LEFT URETER STENT EXCHANGE   . HOLMIUM LASER APPLICATION Left 06/07/2013   Procedure: HOLMIUM LASER APPLICATION;  Surgeon: Milford Cage, MD;  Location: [facility];  Service: Urology;  Laterality: Left;  . right ankle surgery    . SHOULDER SURGERY Left     Current Medications: No outpatient medications have been marked as taking for the 06/29/18 encounter (Appointment) with Lyn Records, MD.     Allergies:   Penicillins   Social History   Socioeconomic History  . Marital status: Divorced    Spouse name: Not on file  . Number of children: 1  . Years  of education: Not on file  . Highest education level: Not on file  Occupational History    Employer: [employer]  Social Needs  . Financial resource strain: Not on file  . Food insecurity:    Worry: Not on file    Inability: Not on file  . Transportation needs:    Medical: Not on file    Non-medical: Not on file  Tobacco Use  . Smoking status: Current Every Day Smoker    Packs/day: 1.00    Years: 40.00    Pack years: 40.00    Types: Cigarettes  . Smokeless tobacco: Never Used  Substance and Sexual Activity  . Alcohol use: No  . Drug use: No  . Sexual activity: Not Currently  Lifestyle  . Physical activity:    Days per week: Not on file    Minutes per session: Not on file  . Stress: Not on file  Relationships  . Social connections:    Talks on phone: Not on file    Gets together: Not on file    Attends religious service: Not on file    Active member of club or organization: Not on file    Attends meetings of clubs or organizations: Not on file    Relationship status: Not on file  Other Topics Concern  . Not on file  Social History Narrative   Works in a [employer], dispensing.  Lives with daughter.      Family History: The patient's ***family history includes  CAD (age of onset: 13) in her brother; CAD (age of onset: 74) in her brother; CAD (age of onset: 53) in her mother; Stroke in her unknown relative.  ROS:   Please see the history of present illness.    *** All other systems reviewed and are negative.  EKGs/Labs/Other Studies Reviewed:    The following studies were reviewed today: 2 D Doppler Echocardiogram: Study Conclusions   - Left ventricle: The cavity size was normal. Wall thickness was   increased in a pattern of mild LVH. Systolic function was   vigorous. The estimated ejection fraction was in the range of 65%   to 70%. Wall motion was normal; there were no regional wall   motion abnormalities. Indeterminate diastolic function. - Aortic valve: Mildly  calcified annulus. Trileaflet. - Mitral valve: Moderately to severely calcified annulus. There was   mild regurgitation. Valve area by pressure half-time: 1.54 cm^2. - Left atrium: The atrium was moderately dilated. - Right atrium: The atrium was mildly dilated. Central venous   pressure (est): 8 mm Hg. - Tricuspid valve: There was mild regurgitation. - Pulmonary arteries: Systolic pressure was moderately increased.   PA peak pressure: 53 mm Hg (S). - Pericardium, extracardiac: A trivial pericardial effusion was   identified posterior to the heart.    EKG:  EKG is *** ordered today.  The ekg ordered today demonstrates ***  Recent Labs: 05/22/2018: B Natriuretic Peptide 353.5 05/23/2018: ALT 12; Hemoglobin 9.5; Magnesium 1.7; Platelets 346; TSH 0.846 05/27/2018: BUN 15; Creatinine, Ser 0.61; Potassium 4.3; Sodium 131  Recent Lipid Panel    Component Value Date/Time   CHOL 194 06/01/2013 1513   TRIG 183.0 (H) 06/01/2013 1513   HDL 41.40 06/01/2013 1513   CHOLHDL 5 06/01/2013 1513   VLDL 36.6 06/01/2013 1513   LDLCALC 116 (H) 06/01/2013 1513    Physical Exam:    VS:  There were no vitals taken for this visit.    Wt Readings from Last 3 Encounters:  05/27/18 128 lb 8 oz (58.3 kg)  06/01/13 142 lb (64.4 kg)  05/31/13 146 lb 6.4 oz (66.4 kg)     GEN: *** Well nourished, well developed in no acute distress HEENT: Normal NECK: No JVD. LYMPHATICS: No lymphadenopathy CARDIAC: ***RRR, ***murmur, ***gallop, *** edema. VASCULAR: *** pulses. *** bruits. RESPIRATORY:  Clear to auscultation without rales, wheezing or rhonchi  ABDOMEN: Soft, non-tender, non-distended, No pulsatile mass, MUSCULOSKELETAL: No deformity  SKIN: Warm and dry NEUROLOGIC:  Alert and oriented x 3 PSYCHIATRIC:  Normal affect   ASSESSMENT:    No diagnosis found. PLAN:    In order of problems listed above:  1. ***   Medication Adjustments/Labs and Tests Ordered: Current medicines are reviewed at  length with the patient today.  Concerns regarding medicines are outlined above.  No orders of the defined types were placed in this encounter.  No orders of the defined types were placed in this encounter.   There are no Patient Instructions on file for this visit.   Signed, Lesleigh Noe, MD  06/28/2018 10:12 AM    [facility]

## 2018-06-29 ENCOUNTER — Ambulatory Visit: Payer: Self-pay | Admitting: Interventional Cardiology

## 2018-07-02 ENCOUNTER — Encounter: Payer: Self-pay | Admitting: Interventional Cardiology

## 2018-09-14 ENCOUNTER — Other Ambulatory Visit: Payer: Self-pay | Admitting: Neurosurgery

## 2018-09-14 DIAGNOSIS — M8580 Other specified disorders of bone density and structure, unspecified site: Secondary | ICD-10-CM

## 2018-09-29 ENCOUNTER — Inpatient Hospital Stay (HOSPITAL_COMMUNITY)
Admission: EM | Admit: 2018-09-29 | Discharge: 2018-10-06 | DRG: 643 | Disposition: A | Discharge status: Home Health | Payer: BLUE CROSS/BLUE SHIELD | Attending: Internal Medicine | Admitting: Internal Medicine

## 2018-09-29 ENCOUNTER — Inpatient Hospital Stay (HOSPITAL_COMMUNITY): Payer: BLUE CROSS/BLUE SHIELD

## 2018-09-29 ENCOUNTER — Encounter (HOSPITAL_COMMUNITY): Payer: Self-pay | Admitting: Emergency Medicine

## 2018-09-29 ENCOUNTER — Emergency Department (HOSPITAL_COMMUNITY): Payer: BLUE CROSS/BLUE SHIELD

## 2018-09-29 DIAGNOSIS — I10 Essential (primary) hypertension: Secondary | ICD-10-CM | POA: Diagnosis not present

## 2018-09-29 DIAGNOSIS — Z79899 Other long term (current) drug therapy: Secondary | ICD-10-CM | POA: Diagnosis not present

## 2018-09-29 DIAGNOSIS — E538 Deficiency of other specified B group vitamins: Secondary | ICD-10-CM | POA: Diagnosis present

## 2018-09-29 DIAGNOSIS — G9341 Metabolic encephalopathy: Secondary | ICD-10-CM | POA: Diagnosis not present

## 2018-09-29 DIAGNOSIS — E871 Hypo-osmolality and hyponatremia: Secondary | ICD-10-CM | POA: Diagnosis present

## 2018-09-29 DIAGNOSIS — Z880 Allergy status to penicillin: Secondary | ICD-10-CM

## 2018-09-29 DIAGNOSIS — E222 Syndrome of inappropriate secretion of antidiuretic hormone: Secondary | ICD-10-CM | POA: Diagnosis present

## 2018-09-29 DIAGNOSIS — Z9114 Patient's other noncompliance with medication regimen: Secondary | ICD-10-CM

## 2018-09-29 DIAGNOSIS — R1312 Dysphagia, oropharyngeal phase: Secondary | ICD-10-CM | POA: Diagnosis present

## 2018-09-29 DIAGNOSIS — R4182 Altered mental status, unspecified: Secondary | ICD-10-CM | POA: Diagnosis not present

## 2018-09-29 DIAGNOSIS — J449 Chronic obstructive pulmonary disease, unspecified: Secondary | ICD-10-CM | POA: Diagnosis present

## 2018-09-29 DIAGNOSIS — D649 Anemia, unspecified: Secondary | ICD-10-CM | POA: Diagnosis present

## 2018-09-29 DIAGNOSIS — Z72820 Sleep deprivation: Secondary | ICD-10-CM

## 2018-09-29 DIAGNOSIS — G9340 Encephalopathy, unspecified: Secondary | ICD-10-CM | POA: Diagnosis present

## 2018-09-29 DIAGNOSIS — F17210 Nicotine dependence, cigarettes, uncomplicated: Secondary | ICD-10-CM | POA: Diagnosis present

## 2018-09-29 DIAGNOSIS — Z823 Family history of stroke: Secondary | ICD-10-CM

## 2018-09-29 DIAGNOSIS — Z781 Physical restraint status: Secondary | ICD-10-CM | POA: Diagnosis not present

## 2018-09-29 DIAGNOSIS — I110 Hypertensive heart disease with heart failure: Secondary | ICD-10-CM | POA: Diagnosis present

## 2018-09-29 DIAGNOSIS — Z7982 Long term (current) use of aspirin: Secondary | ICD-10-CM

## 2018-09-29 DIAGNOSIS — I160 Hypertensive urgency: Secondary | ICD-10-CM | POA: Diagnosis present

## 2018-09-29 DIAGNOSIS — I5033 Acute on chronic diastolic (congestive) heart failure: Secondary | ICD-10-CM | POA: Diagnosis present

## 2018-09-29 DIAGNOSIS — Z90710 Acquired absence of both cervix and uterus: Secondary | ICD-10-CM

## 2018-09-29 DIAGNOSIS — F0390 Unspecified dementia without behavioral disturbance: Secondary | ICD-10-CM | POA: Diagnosis present

## 2018-09-29 DIAGNOSIS — E440 Moderate protein-calorie malnutrition: Secondary | ICD-10-CM | POA: Diagnosis present

## 2018-09-29 DIAGNOSIS — I959 Hypotension, unspecified: Secondary | ICD-10-CM | POA: Diagnosis not present

## 2018-09-29 DIAGNOSIS — R0902 Hypoxemia: Secondary | ICD-10-CM | POA: Diagnosis present

## 2018-09-29 DIAGNOSIS — Z6821 Body mass index (BMI) 21.0-21.9, adult: Secondary | ICD-10-CM | POA: Diagnosis not present

## 2018-09-29 DIAGNOSIS — Z8249 Family history of ischemic heart disease and other diseases of the circulatory system: Secondary | ICD-10-CM

## 2018-09-29 DIAGNOSIS — Z87442 Personal history of urinary calculi: Secondary | ICD-10-CM | POA: Diagnosis not present

## 2018-09-29 DIAGNOSIS — I444 Left anterior fascicular block: Secondary | ICD-10-CM | POA: Diagnosis present

## 2018-09-29 DIAGNOSIS — Z716 Tobacco abuse counseling: Secondary | ICD-10-CM | POA: Diagnosis not present

## 2018-09-29 DIAGNOSIS — R29700 NIHSS score 0: Secondary | ICD-10-CM | POA: Diagnosis present

## 2018-09-29 DIAGNOSIS — F05 Delirium due to known physiological condition: Secondary | ICD-10-CM | POA: Diagnosis present

## 2018-09-29 HISTORY — DX: Acute on chronic diastolic (congestive) heart failure: I50.33

## 2018-09-29 LAB — I-STAT CG4 LACTIC ACID, ED
Lactic Acid, Venous: 1.39 mmol/L (ref 0.5–1.9)
Lactic Acid, Venous: 1.46 mmol/L (ref 0.5–1.9)

## 2018-09-29 LAB — GLUCOSE, CAPILLARY: Glucose-Capillary: 109 mg/dL — ABNORMAL HIGH (ref 70–99)

## 2018-09-29 LAB — CBC
HCT: 39.5 % (ref 36.0–46.0)
Hemoglobin: 13.9 g/dL (ref 12.0–15.0)
MCH: 29.8 pg (ref 26.0–34.0)
MCHC: 35.2 g/dL (ref 30.0–36.0)
MCV: 84.6 fL (ref 80.0–100.0)
Platelets: 357 10*3/uL (ref 150–400)
RBC: 4.67 MIL/uL (ref 3.87–5.11)
RDW: 12.4 % (ref 11.5–15.5)
WBC: 11.5 10*3/uL — ABNORMAL HIGH (ref 4.0–10.5)
nRBC: 0 % (ref 0.0–0.2)

## 2018-09-29 LAB — URINALYSIS, ROUTINE W REFLEX MICROSCOPIC
Bilirubin Urine: NEGATIVE
Glucose, UA: NEGATIVE mg/dL
Hgb urine dipstick: NEGATIVE
Ketones, ur: 5 mg/dL — AB
Leukocytes, UA: NEGATIVE
NITRITE: NEGATIVE
Protein, ur: NEGATIVE mg/dL
Specific Gravity, Urine: 1.014 (ref 1.005–1.030)
pH: 6 (ref 5.0–8.0)

## 2018-09-29 LAB — PROTIME-INR
INR: 1.05
PROTHROMBIN TIME: 13.6 s (ref 11.4–15.2)

## 2018-09-29 LAB — I-STAT CHEM 8, ED
BUN: 15 mg/dL (ref 8–23)
Calcium, Ion: 1.21 mmol/L (ref 1.15–1.40)
Chloride: 81 mmol/L — ABNORMAL LOW (ref 98–111)
Creatinine, Ser: 0.6 mg/dL (ref 0.44–1.00)
Glucose, Bld: 123 mg/dL — ABNORMAL HIGH (ref 70–99)
HEMATOCRIT: 46 % (ref 36.0–46.0)
Hemoglobin: 15.6 g/dL — ABNORMAL HIGH (ref 12.0–15.0)
Potassium: 5 mmol/L (ref 3.5–5.1)
Sodium: 116 mmol/L — CL (ref 135–145)
TCO2: 31 mmol/L (ref 22–32)

## 2018-09-29 LAB — I-STAT TROPONIN, ED: Troponin i, poc: 0.01 ng/mL (ref 0.00–0.08)

## 2018-09-29 LAB — I-STAT ARTERIAL BLOOD GAS, ED
Acid-Base Excess: 5 mmol/L — ABNORMAL HIGH (ref 0.0–2.0)
Bicarbonate: 31.3 mmol/L — ABNORMAL HIGH (ref 20.0–28.0)
O2 Saturation: 91 %
TCO2: 33 mmol/L — ABNORMAL HIGH (ref 22–32)
pCO2 arterial: 48.2 mmHg — ABNORMAL HIGH (ref 32.0–48.0)
pH, Arterial: 7.421 (ref 7.350–7.450)
pO2, Arterial: 62 mmHg — ABNORMAL LOW (ref 83.0–108.0)

## 2018-09-29 LAB — DIFFERENTIAL
Abs Immature Granulocytes: 0.11 10*3/uL — ABNORMAL HIGH (ref 0.00–0.07)
Basophils Absolute: 0 10*3/uL (ref 0.0–0.1)
Basophils Relative: 0 %
Eosinophils Absolute: 0 10*3/uL (ref 0.0–0.5)
Eosinophils Relative: 0 %
Immature Granulocytes: 1 %
Lymphocytes Relative: 7 %
Lymphs Abs: 0.8 10*3/uL (ref 0.7–4.0)
Monocytes Absolute: 0.7 10*3/uL (ref 0.1–1.0)
Monocytes Relative: 6 %
Neutro Abs: 9.9 10*3/uL — ABNORMAL HIGH (ref 1.7–7.7)
Neutrophils Relative %: 86 %

## 2018-09-29 LAB — COMPREHENSIVE METABOLIC PANEL
ALT: 18 U/L (ref 0–44)
AST: 42 U/L — ABNORMAL HIGH (ref 15–41)
Albumin: 3.7 g/dL (ref 3.5–5.0)
Alkaline Phosphatase: 82 U/L (ref 38–126)
Anion gap: 9 (ref 5–15)
BUN: 12 mg/dL (ref 8–23)
CO2: 27 mmol/L (ref 22–32)
CREATININE: 0.61 mg/dL (ref 0.44–1.00)
Calcium: 9.6 mg/dL (ref 8.9–10.3)
Chloride: 82 mmol/L — ABNORMAL LOW (ref 98–111)
GFR calc Af Amer: >60 mL/min (ref >60)
GFR calc non Af Amer: >60 mL/min (ref >60)
Glucose, Bld: 124 mg/dL — ABNORMAL HIGH (ref 70–99)
Potassium: 5 mmol/L (ref 3.5–5.1)
Sodium: 118 mmol/L — CL (ref 135–145)
Total Bilirubin: 1 mg/dL (ref 0.3–1.2)
Total Protein: 6.7 g/dL (ref 6.5–8.1)

## 2018-09-29 LAB — SODIUM
SODIUM: 120 mmol/L — AB (ref 135–145)
Sodium: 119 mmol/L — CL (ref 135–145)
Sodium: 124 mmol/L — ABNORMAL LOW (ref 135–145)

## 2018-09-29 LAB — BRAIN NATRIURETIC PEPTIDE: B NATRIURETIC PEPTIDE 5: 81 pg/mL (ref 0.0–100.0)

## 2018-09-29 LAB — RAPID URINE DRUG SCREEN, HOSP PERFORMED
Amphetamines: NOT DETECTED
Barbiturates: NOT DETECTED
Benzodiazepines: NOT DETECTED
Cocaine: NOT DETECTED
Opiates: NOT DETECTED
Tetrahydrocannabinol: NOT DETECTED

## 2018-09-29 LAB — APTT: aPTT: 30 seconds (ref 24–36)

## 2018-09-29 LAB — CREATININE, URINE, RANDOM: Creatinine, Urine: 113 mg/dL

## 2018-09-29 LAB — POTASSIUM: Potassium: 3.9 mmol/L (ref 3.5–5.1)

## 2018-09-29 LAB — MRSA PCR SCREENING: MRSA by PCR: NEGATIVE

## 2018-09-29 LAB — TSH: TSH: 0.384 u[IU]/mL (ref 0.350–4.500)

## 2018-09-29 LAB — OSMOLALITY: Osmolality: 252 mOsm/kg — ABNORMAL LOW (ref 275–295)

## 2018-09-29 LAB — OSMOLALITY, URINE: Osmolality, Ur: 387 mOsm/kg (ref 300–900)

## 2018-09-29 LAB — CBG MONITORING, ED: Glucose-Capillary: 123 mg/dL — ABNORMAL HIGH (ref 70–99)

## 2018-09-29 LAB — SODIUM, URINE, RANDOM: SODIUM UR: 43 mmol/L

## 2018-09-29 LAB — ETHANOL: Alcohol, Ethyl (B): <10 mg/dL (ref <10)

## 2018-09-29 MED ORDER — ORAL CARE MOUTH RINSE
15.0000 mL | Freq: Two times a day (BID) | OROMUCOSAL | Status: DC
  Administered 2018-09-29 – 2018-10-06 (×10): 15 mL via OROMUCOSAL

## 2018-09-29 MED ORDER — NICOTINE 21 MG/24HR TD PT24
21.0000 mg | MEDICATED_PATCH | Freq: Every day | TRANSDERMAL | Status: DC
  Administered 2018-09-30 – 2018-10-06 (×7): 21 mg via TRANSDERMAL
  Filled 2018-09-29 (×8): qty 1

## 2018-09-29 MED ORDER — SODIUM CHLORIDE 3 % IV SOLN
INTRAVENOUS | Status: DC
  Administered 2018-09-29: 40 mL/h via INTRAVENOUS
  Filled 2018-09-29 (×2): qty 500

## 2018-09-29 MED ORDER — SODIUM CHLORIDE 3 % IV SOLN
INTRAVENOUS | Status: AC
  Administered 2018-09-29: 40 mL/h via INTRAVENOUS
  Filled 2018-09-29: qty 500

## 2018-09-29 MED ORDER — HYDRALAZINE HCL 20 MG/ML IJ SOLN
10.0000 mg | INTRAMUSCULAR | Status: DC | PRN
  Filled 2018-09-29: qty 1

## 2018-09-29 MED ORDER — SODIUM CHLORIDE 3 % IV SOLN: INTRAVENOUS | Status: DC

## 2018-09-29 MED ORDER — HEPARIN SODIUM (PORCINE) 5000 UNIT/ML IJ SOLN
5000.0000 [IU] | Freq: Three times a day (TID) | INTRAMUSCULAR | Status: DC
  Administered 2018-09-29 – 2018-10-06 (×22): 5000 [IU] via SUBCUTANEOUS
  Filled 2018-09-29 (×22): qty 1

## 2018-09-29 MED ORDER — FUROSEMIDE 10 MG/ML IJ SOLN
40.0000 mg | INTRAMUSCULAR | Status: AC
  Administered 2018-09-29: 40 mg via INTRAVENOUS
  Filled 2018-09-29: qty 4

## 2018-09-29 MED ORDER — FUROSEMIDE 10 MG/ML IJ SOLN
40.0000 mg | Freq: Once | INTRAMUSCULAR | Status: AC
  Administered 2018-09-29: 40 mg via INTRAVENOUS
  Filled 2018-09-29: qty 4

## 2018-09-29 NOTE — ED Provider Notes (Signed)
[facility] EMERGENCY DEPARTMENT Provider Note   CSN: [id number] Arrival date & time: 09/29/18  1426   An emergency department physician performed an initial assessment on this suspected stroke patient at 44.  History   Chief Complaint Chief Complaint  Patient presents with  . Altered Mental Status  . Code Stroke    HPI Kim Wagner is a 62 y.o. female.  63yo F w/ PMH including CHF, COPD, hyponatremia who p/w AMS. Today just PTA, Daughter found her in the bathroom slumped over leaning against shower door. She was confused, unable to answer questions, drooling, so daughter called EMS. PT had been in bathroom for ~1 hour. She was in usual state of health this morning. No recent illlness including no fever, vomiting, diarrhea. She smokes 4 PPD. No medication changes, she is non-compliant with meds.  LEVEL 5 CAVEAT DUE TO AMS  The history is provided by a relative.  Altered Mental Status    Past Medical History:  Diagnosis Date  . Chest discomfort    ONLY "IF RUNNING TO PLAY WITH GRANDCHILDREN" - PT IS A SMOKER AND HAS STRONG FAMILY HX OF HEART PROBLEMS AND WILL HAVE NUCLEAR STRESS TEST TUESDAY 06/01/13 FOR CLEARANCE FOR PLANNED URETEROSCOPY, LASER LITHO SURGERY ON 9/15.  Marland Kitchen History of kidney stones     Patient Active Problem List   Diagnosis Date Noted  . Acute encephalopathy 09/29/2018  . Fluid overload 05/23/2018  . Leg edema 05/22/2018  . Back pain 05/22/2018  . Anemia 05/22/2018  . Hyponatremia 05/22/2018  . Dyspnea 05/31/2013  . Preop examination 05/31/2013    Past Surgical History:  Procedure Laterality Date  . ABDOMINAL HYSTERECTOMY    . CYSTOSCOPY W/ RETROGRADES Left 04/18/2013   Procedure: CYSTOSCOPY WITH LEFT RETROGRADE PYELOGRAM; URETER STENT PLACEMENT;  Surgeon: Milford Cage, MD;  Location: [facility] OR;  Service: Urology;  Laterality: Left;  . CYSTOSCOPY WITH RETROGRADE PYELOGRAM, URETEROSCOPY AND STENT PLACEMENT Left 06/07/2013   Procedure: CYSTOSCOPY, BILATERAL RETROGRADE PYELOGRAM, LEFT URETEROSCOPY, LEFT LASER LITHOTRIPSY, LEFT URETER STENT EXCHANGE;  Surgeon: Milford Cage, MD;  Location: [facility];  Service: Urology;  Laterality: Left;  CYSTOSCOPY, BILATERAL RETROGRADE PYELOGRAM, LEFT URETEROSCOPY, LEFT LASER LITHOTRIPSY, LEFT URETER STENT EXCHANGE   . HOLMIUM LASER APPLICATION Left 06/07/2013   Procedure: HOLMIUM LASER APPLICATION;  Surgeon: Milford Cage, MD;  Location: [facility];  Service: Urology;  Laterality: Left;  . right ankle surgery    . SHOULDER SURGERY Left      OB History   No obstetric history on file.      Home Medications    Prior to Admission medications   Medication Sig Start Date End Date Taking? Authorizing Provider  aspirin EC 81 MG tablet Take 81 mg by mouth at bedtime.    [provider]  feeding supplement, ENSURE ENLIVE, (ENSURE ENLIVE) LIQD Take 237 mLs by mouth 2 (two) times daily between meals. 05/26/18   Kathlen Mody, MD  ferrous sulfate 325 (65 FE) MG tablet Take 1 tablet (325 mg total) by mouth 2 (two) times daily with a meal. 05/26/18   Kathlen Mody, MD  furosemide (LASIX) 40 MG tablet Take 1 tablet (40 mg total) by mouth daily. 05/27/18   Kathlen Mody, MD  gabapentin (NEURONTIN) 300 MG capsule Take 300 mg by mouth at bedtime as needed (for nerve pain).  03/12/18   [provider]  Multiple Vitamins-Minerals (CENTRUM WOMEN) TABS Take 1 tablet by mouth daily.    [provider]  ramipril (ALTACE) 1.25 MG capsule Take 1 capsule (1.25 mg total) by mouth 2 (two) times daily. 05/26/18   Kathlen ModyAkula, Vijaya, MD  thiamine 100 MG tablet Take 1 tablet (100 mg total) by mouth daily. 05/27/18   Kathlen ModyAkula, Vijaya, MD  vitamin B-12 (CYANOCOBALAMIN) 500 MCG tablet Take 1 tablet (500 mcg total) by mouth daily. 05/27/18   Kathlen ModyAkula, Vijaya, MD    Family History Family History  Problem Relation Age of Onset  . Stroke Other   . CAD Mother 1257  . CAD Brother 1854  . CAD Brother 6948     Social History Social History   Tobacco Use  . Smoking status: Current Every Day Smoker    Packs/day: 3.00    Years: 40.00    Pack years: 120.00    Types: Cigarettes  . Smokeless tobacco: Never Used  Substance Use Topics  . Alcohol use: No  . Drug use: No     Allergies   Penicillins   Review of Systems Review of Systems  Unable to perform ROS: Mental status change     Physical Exam Updated Vital Signs BP (!) 194/111   Pulse 76   Temp (!) 97.4 F (36.3 C) (Oral)   Resp 13   SpO2 91%   Physical Exam Vitals signs and nursing note reviewed.  Constitutional:      General: She is not in acute distress.    Appearance: She is well-developed.     Comments: Somnolent, snoring respirations  HENT:     Head: Normocephalic and atraumatic.     Nose: Nose normal.     Mouth/Throat:     Mouth: Mucous membranes are moist.  Eyes:     Conjunctiva/sclera: Conjunctivae normal.     Pupils: Pupils are equal, round, and reactive to light.  Neck:     Comments: In c-collar Cardiovascular:     Rate and Rhythm: Normal rate and regular rhythm.     Heart sounds: Normal heart sounds. No murmur.  Pulmonary:     Effort: Pulmonary effort is normal.     Comments: Rhonchi b/l with rales in L lung Abdominal:     General: Bowel sounds are normal. There is no distension.     Palpations: Abdomen is soft.     Tenderness: There is no abdominal tenderness.  Musculoskeletal: Normal range of motion.        General: No tenderness.     Right lower leg: Edema present.     Left lower leg: Edema present.     Comments: Mild BLE edema  Skin:    General: Skin is warm and dry.     Comments: Ecchymosis R lateral thigh  Neurological:     Comments: Somnolent, arouses w/ sternal rub, briefly follows commands to demonstrate 5/5 strength x all 4 extremities but quickly falls back asleep      ED Treatments / Results  Labs (all labs ordered are listed, but only abnormal results are  displayed) Labs Reviewed  CBC - Abnormal; Notable for the following components:      Result Value   WBC 11.5 (*)    All other components within normal limits  DIFFERENTIAL - Abnormal; Notable for the following components:   Neutro Abs 9.9 (*)    Abs Immature Granulocytes 0.11 (*)    All other components within normal limits  COMPREHENSIVE METABOLIC PANEL - Abnormal; Notable for the following components:   Sodium 118 (*)    Chloride 82 (*)    Glucose, Bld  124 (*)    AST 42 (*)    All other components within normal limits  URINALYSIS, ROUTINE W REFLEX MICROSCOPIC - Abnormal; Notable for the following components:   Ketones, ur 5 (*)    All other components within normal limits  I-STAT CHEM 8, ED - Abnormal; Notable for the following components:   Sodium 116 (*)    Chloride 81 (*)    Glucose, Bld 123 (*)    Hemoglobin 15.6 (*)    All other components within normal limits  CBG MONITORING, ED - Abnormal; Notable for the following components:   Glucose-Capillary 123 (*)    All other components within normal limits  ETHANOL  PROTIME-INR  APTT  RAPID URINE DRUG SCREEN, HOSP PERFORMED  BRAIN NATRIURETIC PEPTIDE  SODIUM, URINE, RANDOM  CREATININE, URINE, RANDOM  OSMOLALITY  OSMOLALITY, URINE  BLOOD GAS, ARTERIAL  SODIUM  SODIUM  SODIUM  I-STAT TROPONIN, ED  I-STAT CG4 LACTIC ACID, ED  I-STAT CG4 LACTIC ACID, ED    EKG EKG Interpretation  Date/Time:  Tuesday September 29 2018 14:51:09 EST Ventricular Rate:  85 PR Interval:    QRS Duration: 89 QT Interval:  363 QTC Calculation: 432 R Axis:   -45 Text Interpretation:  Sinus rhythm LAE, consider biatrial enlargement Left anterior fascicular block Anterior infarct, old No significant change since last tracing Confirmed by Frederick Peers [phone]) on 09/29/2018 3:06:41 PM   Radiology Dg Chest Port 1 View  Result Date: 09/29/2018 CLINICAL DATA:  Shortness of breath. EXAM: PORTABLE CHEST 1 VIEW COMPARISON:  CT chest 05/23/2018.  Chest x-ray 05/22/2018, 01/02/2018. FINDINGS: Patient rotated to the right. Stable aortic tortuosity noted. Stable cardiomegaly. No pulmonary venous congestion. No focal infiltrate. No pleural effusion or pneumothorax. Degenerative change thoracic spine. IMPRESSION: 1. Stable thoracic aortic tortuosity and cardiomegaly. No pulmonary venous congestion. 2.  No acute pulmonary disease. Electronically Signed   By: Maisie Fus  Register   On: 09/29/2018 15:44   Ct Head Code Stroke Wo Contrast  Result Date: 09/29/2018 CLINICAL DATA:  Code stroke. Code stroke. Altered level of consciousness. EXAM: CT HEAD WITHOUT CONTRAST TECHNIQUE: Contiguous axial images were obtained from the base of the skull through the vertex without intravenous contrast. 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 No acute infarct, hemorrhage, or mass lesion is present. No acute cortical infarct is present. Basal ganglia are intact bilaterally. Insular ribbon is normal. The brainstem and cerebellum are normal. Vascular: Intracranial vessels are diffusely hyperdense. There is no focal asymmetry. Vascular calcifications are present within the cavernous internal carotid arteries. Skull: Calvarium is intact. No focal lytic or blastic lesions are present. Sinuses/Orbits: The paranasal sinuses and mastoid air cells are clear. The globes and orbits are within normal limits. ASPECTS [facility] Stroke Program Early CT Score) - Ganglionic level infarction (caudate, lentiform nuclei, internal capsule, insula, M1-M3 cortex): 7/7 - Supraganglionic infarction (M4-M6 cortex): 3/3 Total score (0-10 with 10 being normal): 10/10 IMPRESSION: 1. No acute intracranial abnormality or significant interval change. 2. Stable white matter disease. 3. Intracranial vessels are diffusely hyperdense. Question increased hemoglobin. 4. ASPECTS is 10/10 Electronically Signed   By: Marin Roberts M.D.   On: 09/29/2018 14:48    Procedures .Critical Care Performed by: Laurence Spates, MD Authorized by: Laurence Spates, MD   Critical care provider statement:    Critical care time (minutes):  35   Critical care time was exclusive of:  Separately billable procedures and treating other patients   Critical care was necessary to treat or prevent imminent or life-threatening deterioration of the following conditions:  CNS failure or compromise and metabolic crisis   Critical care was time spent personally by me on the following activities:  Development of treatment plan with patient or surrogate, discussions with consultants, evaluation of patient's response to treatment, examination of patient, obtaining history from patient or surrogate, ordering and performing treatments and interventions, ordering and review of laboratory studies, ordering and review of radiographic studies, re-evaluation of patient's condition and review of old charts   (including critical care time)  Medications Ordered in ED Medications  furosemide (LASIX) injection 40 mg (has no administration in time range)  hydrALAZINE (APRESOLINE) injection 10-40 mg (has no administration in time range)  sodium chloride (hypertonic) 3 % solution (has no administration in time range)  heparin injection 5,000 Units (has no administration in time range)  nicotine (NICODERM CQ - dosed in mg/24 hours) patch 21 mg (has no administration in time range)  furosemide (LASIX) injection 40 mg (40 mg Intravenous Given 09/29/18 1603)     Initial Impression / Assessment and Plan / ED Course  I have reviewed the triage vital signs and the nursing notes.  Pertinent labs & imaging results that were available during my care of the patient were reviewed by me and considered in my medical decision making (see chart for details).    Patient initially arrived as code stroke for altered mental status, was taken to CT scan where  head CT was negative acute.  She was evaluated by the neurology service.  Her i-STAT came back with sodium of 116, stroke alert canceled as her altered mentation is likely due to hyponatremia rather than acute stroke.  Has h/o hyponatremia in setting of CHF exacerbation.   CMP shows sodium 118.  The remainder of her lab work is unremarkable.  Discussed with critical care, I have ordered Lasix as I suspect volume overload based on mild oxygen requirement and abnormal lung sounds.  I have also ordered a small bolus of hypertonic saline given the patient's significant altered mental status.  She is currently protecting airway. Pt admitted to MICU for further treatment.  Final Clinical Impressions(s) / ED Diagnoses   Final diagnoses:  None    ED Discharge Orders    None       Skylynne Schlechter, Ambrose Finlandachel Morgan, MD 09/29/18 1700

## 2018-09-29 NOTE — ED Notes (Addendum)
Celine Mans, PA contacted to confirm second dose of lasix. He further states that c-collar can be removed if ct negative. Also states to follow hypertonic saline with q2 hour sodium.

## 2018-09-29 NOTE — ED Notes (Signed)
Urine Culture sent to main lab with UA.  

## 2018-09-29 NOTE — ED Triage Notes (Signed)
Pt arrives EMS from home and innitialy code stroke. Pt met by team at bridge with labs drawn and transported to CT 2 where non contrast ct performed.  Pt was found at home slummped on toilet and unresponsive. Pt remains very lethargic and while rousable pt rapidly falls back to sleep. Code stroke cancelled and pt transported to Room 40. Pt alert and rouses to voice and pain but returns to sleep. Pt wearing c-collar because EMS states they needed it to maintain airway. Pt leans to right

## 2018-09-29 NOTE — ED Notes (Signed)
Pt much more alert

## 2018-09-29 NOTE — ED Notes (Signed)
Patient transported to CT 

## 2018-09-29 NOTE — Consult Note (Addendum)
Neurology Consultation  Reason for Consult: Code stroke Referring Physician: Estell HarpinZammit  CC: Altered mental status  History is obtained from: EMS  HPI: Kim Wagner is a 63 y.o. female with history of kidney stones, congestive heart failure, chest discomfort,heavy tobacco abuse.   She is supposed to be on Lasix however per daughter she has not been taking it.  Today at 1300 hrs. patient went to the bathroom and did not come out for an hour.  When daughter went in to see how she was doing she was found to be leaning to the right, with altered mental status, only able to be awoken by sternal rub or noxious stimuli.  When awoken she would quickly fall back to sleep.  Patient had audible gurgling sounds and saturations were at approximately 90% when EMS arrived.  When nasal cannula was placed by EMS her saturations went up to approximately 95-100.  Patient required a c-collar to keep her airway open during transportation [facility].  On arrival patient would awaken to noxious stimuli, was able to answer questions and follow commands when awake but if not stimulated would fall back asleep and leaning to the right.  CT scan was obtained and did not show any acute stroke.   ED course-CT of head which showed no acute stroke.  ABG ordered.  Labs show a sodium of 116 with no white blood cell count   Chart review: No neurological past notes   LKW: 1300 hrs. on 09/29/2018 tpa given?: no, symptoms to mild and nondisabling and also low suspicion for stroke Premorbid modified Rankin scale (mRS): 0 NIH stroke scale of 0  ROS:  Unable to obtain due to altered mental status.   Past Medical History:  Diagnosis Date  . Chest discomfort    ONLY "IF RUNNING TO PLAY WITH GRANDCHILDREN" - PT IS A SMOKER AND HAS STRONG FAMILY HX OF HEART PROBLEMS AND WILL HAVE NUCLEAR STRESS TEST TUESDAY 06/01/13 FOR CLEARANCE FOR PLANNED URETEROSCOPY, LASER LITHO SURGERY ON 9/15.  Marland Kitchen. History of kidney stones      Family  History  Problem Relation Age of Onset  . Stroke Other   . CAD Mother 6257  . CAD Brother 3554  . CAD Brother 6348    Social History:   reports that she has been smoking cigarettes. She has a 40.00 pack-year smoking history. She has never used smokeless tobacco. She reports that she does not drink alcohol or use drugs.  Medications No current facility-administered medications for this encounter.   Current Outpatient Medications:  .  aspirin EC 81 MG tablet, Take 81 mg by mouth at bedtime., Disp: , Rfl:  .  feeding supplement, ENSURE ENLIVE, (ENSURE ENLIVE) LIQD, Take 237 mLs by mouth 2 (two) times daily between meals., Disp: 237 mL, Rfl: 12 .  ferrous sulfate 325 (65 FE) MG tablet, Take 1 tablet (325 mg total) by mouth 2 (two) times daily with a meal., Disp: 60 tablet, Rfl: 3 .  furosemide (LASIX) 40 MG tablet, Take 1 tablet (40 mg total) by mouth daily., Disp: 30 tablet, Rfl: 1 .  gabapentin (NEURONTIN) 300 MG capsule, Take 300 mg by mouth at bedtime as needed (for nerve pain). , Disp: , Rfl: 4 .  Multiple Vitamins-Minerals (CENTRUM WOMEN) TABS, Take 1 tablet by mouth daily., Disp: , Rfl:  .  ramipril (ALTACE) 1.25 MG capsule, Take 1 capsule (1.25 mg total) by mouth 2 (two) times daily., Disp: 60 capsule, Rfl: 0 .  thiamine 100  MG tablet, Take 1 tablet (100 mg total) by mouth daily., Disp: 30 tablet, Rfl: 0 .  vitamin B-12 (CYANOCOBALAMIN) 500 MCG tablet, Take 1 tablet (500 mcg total) by mouth daily., Disp: 30 tablet, Rfl: 0   Exam: Current vital signs: Pulse 88   Temp (!) 97.4 F (36.3 C) (Oral)   Resp (!) 9   SpO2 90%  Vital signs in last 24 hours: Temp:  [97.4 F (36.3 C)] 97.4 F (36.3 C) (01/07 1449) Pulse Rate:  [88] 88 (01/07 1449) Resp:  [9] 9 (01/07 1449) SpO2:  [90 %] 90 % (01/07 1449)  Physical Exam  Constitutional: Somnolent Psych: Somnolent Eyes: No scleral injection HENT: No OP obstrucion Head: Normocephalic.  Cardiovascular: Normal rate and regular rhythm.   Respiratory: Effort normal, low volume and rhonchorous GI: Soft.  No distension. There is no tenderness.  Skin: WDI  Neuro: Mental Status: Patient is very somnolent however if awaken patient is able to converse.  Patient does know that she is at the hospital, able to give age, and month.  She is able to follow commands.  If not stimulated she will fall back asleep. Cranial Nerves: II: Visual Fields are full.    III,IV, VI: EOMI without ptosis or diploplia. Pupils are equal, round, and reactive to light. V: Facial sensation is symmetric to temperature VII: Facial movement is symmetric.  VIII: hearing is intact to voice X: Uvula elevates symmetrically XI: Shoulder shrug is symmetric. XII: tongue is midline without atrophy or fasciculations.  Motor: Moving all extremities antigravity Sensory: Withdraws to pain in all extremities in addition states there is no asymmetry in light touch Deep Tendon Reflexes: Plus in upper extremities no knee jerk bilaterally no ankle jerk bilaterally Plantars: Toes are downgoing bilaterally.  Cerebellar: Finger-nose within normal limits  Labs I have reviewed labs in epic and the results pertinent to this consultation are:   CBC    Component Value Date/Time   WBC 8.3 05/23/2018 0827   RBC 4.32 05/23/2018 0827   HGB 15.6 (H) 09/29/2018 1445   HCT 46.0 09/29/2018 1445   PLT 346 05/23/2018 0827   MCV 75.7 (L) 05/23/2018 0827   MCH 22.0 (L) 05/23/2018 0827   MCHC 29.1 (L) 05/23/2018 0827   RDW 20.7 (H) 05/23/2018 0827   LYMPHSABS 1.8 05/23/2018 0827   MONOABS 0.9 05/23/2018 0827   EOSABS 0.1 05/23/2018 0827   BASOSABS 0.0 05/23/2018 0827    CMP     Component Value Date/Time   NA 116 (LL) 09/29/2018 1445   K 5.0 09/29/2018 1445   CL 81 (L) 09/29/2018 1445   CO2 38 (H) 05/27/2018 0438   GLUCOSE 123 (H) 09/29/2018 1445   BUN 15 09/29/2018 1445   CREATININE 0.60 09/29/2018 1445   CALCIUM 9.5 05/27/2018 0438   PROT 5.7 (L) 05/23/2018  0827   ALBUMIN 2.9 (L) 05/23/2018 0827   AST 15 05/23/2018 0827   ALT 12 05/23/2018 0827   ALKPHOS 158 (H) 05/23/2018 0827   BILITOT 0.7 05/23/2018 0827   GFRNONAA >60 05/27/2018 0438   GFRAA >60 05/27/2018 0438    Lipid Panel     Component Value Date/Time   CHOL 194 06/01/2013 1513   TRIG 183.0 (H) 06/01/2013 1513   HDL 41.40 06/01/2013 1513   CHOLHDL 5 06/01/2013 1513   VLDL 36.6 06/01/2013 1513   LDLCALC 116 (H) 06/01/2013 1513     Imaging I have reviewed the images obtained:  CT-scan of the brain-no acute intracranial abnormality  or significant interval change.  Stable white matter disease.    Felicie Mornavid Smith PA-C [facility] [phone]  M-F  (9:00 am- 5:00 PM)  09/29/2018, 2:51 PM     Assessment:   Altered mental status in the setting what appears to be respiratory failure in addition to hyponatremia of 116.  CT head was negative for acute findings.  No lateralizing symptoms and therefore stroke was canceled.  Not a candidate for TPA if this were stroke, due to mild and nondisabling symptoms.  Acute metabolic encephalopathy likely secondary to hyponatremia/hypoxia versus hypercarbia   Recommendations: -Correct sodium -ABG --Address metabolic/toxic insults --Patient not return to her baseline and can obtain MRI brain.   NEUROHOSPITALIST ADDENDUM Performed a face to face diagnostic evaluation.   I have reviewed the contents of history and physical exam as documented by PA/ARNP/Resident and agree with above documentation.  I have discussed and formulated the above plan as documented. Edits to the note have been made as needed.  Patient presented with altered mental status and was stroke alerted by EMS.  According to EMS patient was found in the bathroom leaning to the right.  Patient was lethargic and with answer questions but falls back asleep.  No lateralizing deficits.  Arrival patient was oriented to place and person.  No drift noted on either  extremity.  Stat CT head was obtained to rule out hemorrhage.  Further work-up in ED revealed sodium 116.  ABG the patient's PCO2 of 48 and hypoxia with PO2 of 60.  Probe alert was canceled because low suspicion that this is a stroke and likely altered mental status in the setting of metabolic derangements.  Think the patient needs a MRI brain, however if she does not return to baseline after correcting for metabolic toxic insults-then an MRI brain without contrast can be performed to rule out stroke.    Georgiana SpinnerSushanth Aroor MD [facility] [phone]   If 7pm to 7am, please call on call as listed on AMION.

## 2018-09-29 NOTE — ED Notes (Signed)
Pt placed on purewick due to lethargy.

## 2018-09-29 NOTE — ED Notes (Signed)
PCXR at bedside.

## 2018-09-29 NOTE — ED Notes (Signed)
MD aware of abnormal sodium

## 2018-09-29 NOTE — ED Notes (Signed)
Pt rouses and follows some commands but soes not respond and answer questions.

## 2018-09-29 NOTE — Code Documentation (Signed)
63 year old female presents via [facility] as code stroke which was called in the field.  EMS reports family states patient was LSW at 1300 when she walked to the bathroom.  The family found her one hour later sitting on the toilet leaning up next to the shower - no fall was reported.  EMS reports HTN 206/114 and AMS with slurred speech.  She was met at the bridge by the stroke team and ED staff.  She is sleepy - arouses to name - follows commands - moves all extremities - no aphasia but does present with mild dysarthria questionably related to AMS and  with lots of mucus present in oral cavity.    Hx of COPD - continues with heavy smoking per family.  Family reports mild confusion at baseline.  She is leaning to the right side but will reposition to midline on command. NIHHS 1.   Easily falls back to sleep without stimulation.  CT scan done.  Dr. Lorraine Lax at bedside.  Code stroke cancelled at 1440.  Handoff to [employer].

## 2018-09-29 NOTE — ED Notes (Signed)
Pt orally suctioned for moderate amount of frothy white sputum. Celine Mans, PA paged to inform of same. C-collar removed as previously instructed for negative ct.

## 2018-09-29 NOTE — H&P (Signed)
NAME:  Kim Wagner, MRN:  [id number], DOB:  [birth year], LOS: 0 ADMISSION DATE:  09/29/2018, CONSULTATION DATE:  09/29/18 REFERRING MD:  Clarene Duke - EDP  CHIEF COMPLAINT:  AMS   Brief History   Kim Wagner is a 63 y.o. female who was admitted 1/7 for acute encephalopathy felt to be due to Na 116.  History of present illness   Kim Wagner is a 63 y.o. female who has a PMH as outlined below (see "past medical history").  She presented to [facility] ED 1/7 due to AMS.  Daughter states she was in her usual state of health earlier that morning around 9:30AM.  From then until around noon, she did not seem to have any problems and had her normal routine (does not do much throughout the day).  Around 1245 - 1300, pt went to the bathroom but did not come out for quite some time.  Around 1320, daughter went to check on her and found her slumped over half way on the commode and leaning onto the shower door.  She was barely arouseable and minimally responsive.  Daughter denies any recent illness.  Denies fevers/chills/sweats, headaches, chest pain, dyspnea.  She was brought to ED where she had CT of the head that did not show any acute abnormality.  CXR was normal.  Lab work was noteable for Na of 116 (acute on chronic, baseline high 120's to low 130's over the past year).  UDS was negative.  BP elevated with SBP in 190s.  On exam, she had evidence of volume overload with 2+ pitting LE edema.  She has very mild JVD; however, lung fields are clear to auscultation.  CXR without edema.  Daughter states that pt hs CHF and HTN and is not fully compliant with medications or PCP follow up.  She states "mom takes her medicine when I'm home to force her, otherwise she skips doses".  Daughter denies excessive water intake, hx of thyroid disease, hx of being on SSRI's.  She does report that pt has been admitted once previously for symptomatic hyponatremia as well as UTI.  No complications that daughter is aware of.  She reports that pt has  significant smoking hx of at least 3 - 4 packs per day for past 40 years.  She was admitted in Aug 2019 for weight loss.  Had multiple CT's including head/chest/abdomen that did not suggest any underlying malignancy.  During that admission, she also had hyponatremia that was attributed to volume overload.  She also had workup for confusion including TSH and B12 that was normal.  Due to symptomatic hyponatremia, she was started on 3% NS and PCCM was asked to evaluate for ICU admission.  Past Medical History  HTN, sCHF (echo from Sept 2019 with EF 65-70%), kidney stones.  Significant Hospital Events   1/7 > admit.  Consults:  None.  Procedures:  None.  Significant Diagnostic Tests:  CXR 1/7 > negative. CT head 1/7 > negative. CT C-spine 1/7 >   Micro Data:  None.  Antimicrobials:  None.   Interim history/subjective:  Minimally responsive but does open eyes to voice and noxious stimuli.  Does not follow commands.  Objective:  Blood pressure (!) 194/111, pulse 76, temperature (!) 97.4 F (36.3 C), temperature source Oral, resp. rate 13, SpO2 91 %.       No intake or output data in the 24 hours ending 09/29/18 1649 There were no vitals filed for this visit.  Examination: General: Adult female, in NAD.  Neuro: Somnolent.  Opens eyes to voice and noxious stimuli but does not answer questions or follow commands. HEENT: [place]/AT. Sclerae anicteric.  Cervical collar in place. Cardiovascular: RRR, no M/R/G.  Lungs: Respirations even and unlabored.  CTA bilaterally, No W/R/R.  Abdomen: BS x 4, soft, NT/ND.  Musculoskeletal: No gross deformities, 2+ LE pitting edema.  Skin: Intact, warm, no rashes.  Assessment & Plan:   Acute encephalopathy - felt to be due to hyponatremia in setting of sCHF / hypervolemia with medication non-compliance; however, why pt became acutely altered and minimally responsive within a few hour time frame is puzzling.  CT head and UDS negative.  Stroke / TIA  remains on differential despite negative initial CT.  PRES also a consideration given SBP 190's with hx medication non-compliance.  - Admit to ICU. - Start 3% NS at rate 40cc/hr for goal correction of 0.5 - 1.0 mmol/L/hr correction. - Assess ABG, TSH. - F/u on urine studies. - Additional 40 mEq lasix now. - If remains encephalopathic despite Na correction, might need brain MRI.  Hypertensive urgency. - Additional lasix as above (total 80mg  in ED). - Hydralazine PRN for goal SBP 150's. - Defer continuous infusion for now, can start if remains hypertensive despite above measures. - Once able to take PO's, can resume preadmission ACEi.   Best Practice:  Diet: NPO. Pain/Anxiety/Delirium protocol (if indicated): N/A. VAP protocol (if indicated): N/A. DVT prophylaxis: SCD's / Heparin. GI prophylaxis: N/A. Glucose control: N/A. Mobility: Bedrest. Code Status: Full. Family Communication: Daughter updated at bedside. Disposition: ICU.  Labs   CBC: Recent Labs  Lab 09/29/18 1429 09/29/18 1445  WBC 11.5*  --   NEUTROABS 9.9*  --   HGB 13.9 15.6*  HCT 39.5 46.0  MCV 84.6  --   PLT 357  --    Basic Metabolic Panel: Recent Labs  Lab 09/29/18 1429 09/29/18 1445  NA 118* 116*  K 5.0 5.0  CL 82* 81*  CO2 27  --   GLUCOSE 124* 123*  BUN 12 15  CREATININE 0.61 0.60  CALCIUM 9.6  --    GFR: CrCl cannot be calculated (Unknown ideal weight.). Recent Labs  Lab 09/29/18 1429 09/29/18 1503  WBC 11.5*  --   LATICACIDVEN  --  1.39   Liver Function Tests: Recent Labs  Lab 09/29/18 1429  AST 42*  ALT 18  ALKPHOS 82  BILITOT 1.0  PROT 6.7  ALBUMIN 3.7   No results for input(s): LIPASE, AMYLASE in the last 168 hours. No results for input(s): AMMONIA in the last 168 hours. ABG    Component Value Date/Time   TCO2 31 09/29/2018 1445    Coagulation Profile: Recent Labs  Lab 09/29/18 1429  INR 1.05   Cardiac Enzymes: No results for input(s): CKTOTAL, CKMB,  CKMBINDEX, TROPONINI in the last 168 hours. HbA1C: No results found for: HGBA1C CBG: Recent Labs  Lab 09/29/18 1429  GLUCAP 123*    Review of Systems:   Unable to obtain as pt is encephalopathic.  Past medical history  She,  has a past medical history of Chest discomfort and History of kidney stones.   Surgical History    Past Surgical History:  Procedure Laterality Date  . ABDOMINAL HYSTERECTOMY    . CYSTOSCOPY W/ RETROGRADES Left 04/18/2013   Procedure: CYSTOSCOPY WITH LEFT RETROGRADE PYELOGRAM; URETER STENT PLACEMENT;  Surgeon: Milford Cageaniel Young Woodruff, MD;  [facility];  Service: Urology;  Laterality: Left;  . CYSTOSCOPY WITH RETROGRADE PYELOGRAM, URETEROSCOPY AND  STENT PLACEMENT Left 06/07/2013   Procedure: CYSTOSCOPY, BILATERAL RETROGRADE PYELOGRAM, LEFT URETEROSCOPY, LEFT LASER LITHOTRIPSY, LEFT URETER STENT EXCHANGE;  Surgeon: Milford Cage, MD;  Location: [facility] ORS;  Service: Urology;  Laterality: Left;  CYSTOSCOPY, BILATERAL RETROGRADE PYELOGRAM, LEFT URETEROSCOPY, LEFT LASER LITHOTRIPSY, LEFT URETER STENT EXCHANGE   . HOLMIUM LASER APPLICATION Left 06/07/2013   Procedure: HOLMIUM LASER APPLICATION;  Surgeon: Milford Cage, MD;  Location: [facility] ORS;  Service: Urology;  Laterality: Left;  . right ankle surgery    . SHOULDER SURGERY Left      Social History   reports that she has been smoking cigarettes. She has a 120.00 pack-year smoking history. She has never used smokeless tobacco. She reports that she does not drink alcohol or use drugs.   Family history   Her family history includes CAD (age of onset: 36) in her brother; CAD (age of onset: 84) in her brother; CAD (age of onset: 32) in her mother; Stroke in an other family member.   Allergies Allergies  Allergen Reactions  . Penicillins Rash    Has patient had a PCN reaction causing immediate rash, facial/tongue/throat swelling, SOB or lightheadedness with hypotension: Yes Has patient had a PCN reaction  causing severe rash involving mucus membranes or skin necrosis: Unk Has patient had a PCN reaction that required hospitalization: No Has patient had a PCN reaction occurring within the last 10 years: No If all of the above answers are "NO", then may proceed with Cephalosporin use.      Home meds  Prior to Admission medications   Medication Sig Start Date End Date Taking? Authorizing Provider  aspirin EC 81 MG tablet Take 81 mg by mouth at bedtime.    [provider]  feeding supplement, ENSURE ENLIVE, (ENSURE ENLIVE) LIQD Take 237 mLs by mouth 2 (two) times daily between meals. 05/26/18   Kathlen Mody, MD  ferrous sulfate 325 (65 FE) MG tablet Take 1 tablet (325 mg total) by mouth 2 (two) times daily with a meal. 05/26/18   Kathlen Mody, MD  furosemide (LASIX) 40 MG tablet Take 1 tablet (40 mg total) by mouth daily. 05/27/18   Kathlen Mody, MD  gabapentin (NEURONTIN) 300 MG capsule Take 300 mg by mouth at bedtime as needed (for nerve pain).  03/12/18   [provider]  Multiple Vitamins-Minerals (CENTRUM WOMEN) TABS Take 1 tablet by mouth daily.    [provider]  ramipril (ALTACE) 1.25 MG capsule Take 1 capsule (1.25 mg total) by mouth 2 (two) times daily. 05/26/18   Kathlen Mody, MD  thiamine 100 MG tablet Take 1 tablet (100 mg total) by mouth daily. 05/27/18   Kathlen Mody, MD  vitamin B-12 (CYANOCOBALAMIN) 500 MCG tablet Take 1 tablet (500 mcg total) by mouth daily. 05/27/18   Kathlen Mody, MD    Critical care time: 35 min.    Rutherford Guys, PA [facility] Pager: [phone].  If no answer, [phone] 09/29/2018, 4:49 PM

## 2018-09-30 ENCOUNTER — Encounter (HOSPITAL_COMMUNITY): Payer: Self-pay | Admitting: Pulmonary Disease

## 2018-09-30 DIAGNOSIS — I5033 Acute on chronic diastolic (congestive) heart failure: Secondary | ICD-10-CM | POA: Diagnosis present

## 2018-09-30 DIAGNOSIS — E871 Hypo-osmolality and hyponatremia: Secondary | ICD-10-CM

## 2018-09-30 DIAGNOSIS — E440 Moderate protein-calorie malnutrition: Secondary | ICD-10-CM | POA: Diagnosis present

## 2018-09-30 DIAGNOSIS — G9340 Encephalopathy, unspecified: Secondary | ICD-10-CM

## 2018-09-30 LAB — BASIC METABOLIC PANEL
ANION GAP: 11 (ref 5–15)
BUN: 10 mg/dL (ref 8–23)
CO2: 28 mmol/L (ref 22–32)
Calcium: 9.1 mg/dL (ref 8.9–10.3)
Chloride: 84 mmol/L — ABNORMAL LOW (ref 98–111)
Creatinine, Ser: 0.48 mg/dL (ref 0.44–1.00)
GFR calc Af Amer: >60 mL/min (ref >60)
GFR calc non Af Amer: >60 mL/min (ref >60)
Glucose, Bld: 79 mg/dL (ref 70–99)
POTASSIUM: 3.5 mmol/L (ref 3.5–5.1)
Sodium: 123 mmol/L — ABNORMAL LOW (ref 135–145)

## 2018-09-30 LAB — CBC
HCT: 37.7 % (ref 36.0–46.0)
Hemoglobin: 12.9 g/dL (ref 12.0–15.0)
MCH: 28.7 pg (ref 26.0–34.0)
MCHC: 34.2 g/dL (ref 30.0–36.0)
MCV: 84 fL (ref 80.0–100.0)
NRBC: 0 % (ref 0.0–0.2)
Platelets: 296 10*3/uL (ref 150–400)
RBC: 4.49 MIL/uL (ref 3.87–5.11)
RDW: 12.2 % (ref 11.5–15.5)
WBC: 11.1 10*3/uL — AB (ref 4.0–10.5)

## 2018-09-30 LAB — SODIUM
Sodium: 125 mmol/L — ABNORMAL LOW (ref 135–145)
Sodium: 125 mmol/L — ABNORMAL LOW (ref 135–145)

## 2018-09-30 LAB — GLUCOSE, CAPILLARY
GLUCOSE-CAPILLARY: 85 mg/dL (ref 70–99)
GLUCOSE-CAPILLARY: 145 mg/dL — AB (ref 70–99)
Glucose-Capillary: 94 mg/dL (ref 70–99)
Glucose-Capillary: 94 mg/dL (ref 70–99)

## 2018-09-30 LAB — PHOSPHORUS: Phosphorus: 3.8 mg/dL (ref 2.5–4.6)

## 2018-09-30 LAB — MAGNESIUM: Magnesium: 1.7 mg/dL (ref 1.7–2.4)

## 2018-09-30 MED ORDER — IPRATROPIUM-ALBUTEROL 0.5-2.5 (3) MG/3ML IN SOLN
3.0000 mL | Freq: Three times a day (TID) | RESPIRATORY_TRACT | Status: DC
  Administered 2018-10-01 – 2018-10-03 (×7): 3 mL via RESPIRATORY_TRACT
  Filled 2018-09-30 (×7): qty 3

## 2018-09-30 MED ORDER — SODIUM CHLORIDE 0.9 % IV BOLUS
500.0000 mL | Freq: Once | INTRAVENOUS | Status: AC
  Administered 2018-09-30: 500 mL via INTRAVENOUS

## 2018-09-30 MED ORDER — IPRATROPIUM-ALBUTEROL 0.5-2.5 (3) MG/3ML IN SOLN
3.0000 mL | Freq: Four times a day (QID) | RESPIRATORY_TRACT | Status: DC
  Administered 2018-09-30 (×2): 3 mL via RESPIRATORY_TRACT
  Filled 2018-09-30 (×2): qty 3

## 2018-09-30 MED ORDER — ENSURE ENLIVE PO LIQD
237.0000 mL | Freq: Two times a day (BID) | ORAL | Status: DC
  Administered 2018-10-01 (×2): 237 mL via ORAL

## 2018-09-30 MED ORDER — ALBUTEROL SULFATE (2.5 MG/3ML) 0.083% IN NEBU: 2.5000 mg | INHALATION_SOLUTION | Freq: Four times a day (QID) | RESPIRATORY_TRACT | Status: DC | PRN

## 2018-09-30 MED ORDER — FUROSEMIDE 10 MG/ML IJ SOLN
40.0000 mg | Freq: Once | INTRAMUSCULAR | Status: AC
  Administered 2018-09-30: 40 mg via INTRAVENOUS
  Filled 2018-09-30: qty 4

## 2018-09-30 NOTE — Progress Notes (Signed)
eLink Physician-Brief Progress Note Patient Name: Kim Wagner DOB: 1956/08/22 MRN: [id number]   Date of Service  09/30/2018  HPI/Events of Note  Pt with diastolic CHF and hyponatremia, given Lasix this morning.  SBP in the 70s over the past hour with MAP <60.  Pt has diuresed about 1.45L.  eICU Interventions  Give 500cc bolus NS. Continue checking serum Na.     Intervention Category Intermediate Interventions: Hypotension - evaluation and management  Larinda Buttery 09/30/2018, 7:35 PM

## 2018-09-30 NOTE — Care Management Note (Signed)
Case Management Note Hortencia Conradi, RN MSN CCM Transitions of Care [address] [place] [phone]  Patient Details  Name: Kim Wagner MRN: [id number] Date of Birth: 03-14-1956  Subjective/Objective:           Acute encephalopathy-hyponatremia         Action/Plan: PTA home with family. Sodium improved. Mental status improving. Will continue to follow for transition of care needs.   Expected Discharge Date:                  Expected Discharge Plan:  Home w Home Health Services  In-House Referral:     Discharge planning Services  CM Consult  Post Acute Care Choice:    Choice offered to:     DME Arranged:    DME Agency:     HH Arranged:    HH Agency:     Status of Service:  In process, will continue to follow  If discussed at Long Length of Stay Meetings, dates discussed:    Additional Comments:  Bess Kinds, RN 09/30/2018, 1:54 PM

## 2018-09-30 NOTE — Progress Notes (Addendum)
Initial Nutrition Assessment  DOCUMENTATION CODES:   Not applicable  INTERVENTION:   Agree with Liberalized diet   Ensure Enlive po BID, each supplement provides 350 kcal and 20 grams of protein  NUTRITION DIAGNOSIS:   Increased nutrient needs related to chronic illness(CHF, smoker, thyroid disease) as evidenced by estimated needs.  GOAL:   Provide needs based on ASPEN/SCCM guidelines  MONITOR:   PO intake, Supplement acceptance, Labs, Weight trends  REASON FOR ASSESSMENT:   Malnutrition Screening Tool    ASSESSMENT:   63 y.o. female with PMH: CHF, thyroid disease, smoker and nephrolithiasis. Admitted to ED severely hyponatremic with volume overload causing acute encephalopathy. Recent [facility] admit in August 2019 for weight loss.   Pt alert and very pleasant. Pt reported feeling much better. Pt reported having a good appetite at home eating 3 meals a day. Usually a waffle bacon and eggs for breakfast, a sandwich for lunch and eating dinner with her mother either at home or out to eat.   Pt reported losing around 20# a year ago once she stopped drinking  Dr. Alcus DadPeppers. Pt reported no recent weight loss. Pt unsure of UBW.  Current weight 115#. Last encounter weight 128# on 9/4. This is a 10% weight loss but this could be fluid.   Medications reviewed.   Labs reviewed:  Na 123 (L)  CBGs 123, 109, 94, 85 X 12 hours  NUTRITION - FOCUSED PHYSICAL EXAM:    Most Recent Value  Orbital Region  No depletion  Upper Arm Region  Mild depletion  Thoracic and Lumbar Region  No depletion  Buccal Region  No depletion  Temple Region  Moderate depletion  Clavicle Bone Region  Mild depletion  Clavicle and Acromion Bone Region  Mild depletion  Scapular Bone Region  Mild depletion  Dorsal Hand  No depletion  Patellar Region  Mild depletion  Anterior Thigh Region  Mild depletion  Posterior Calf Region  Mild depletion  Edema (RD Assessment)  None  Hair  Reviewed  Eyes  Reviewed  Mouth   Reviewed  Skin  Reviewed  Nails  Reviewed       Diet Order:   Diet Order            Diet regular Room service appropriate? Yes; Fluid consistency: Thin; Fluid restriction: 1200 mL Fluid  Diet effective now              EDUCATION NEEDS:   Not appropriate for education at this time  Skin:  Skin Assessment: Reviewed RN Assessment  Last BM:  PTA  Height:   Ht Readings from Last 1 Encounters:  09/29/18 5\' 1"  (1.549 m)    Weight:   Wt Readings from Last 1 Encounters:  09/30/18 52.6 kg    Ideal Body Weight:  47 kg  BMI:  Body mass index is 21.91 kg/m.  Estimated Nutritional Needs:   Kcal:  1300-1500 kcal/d   Protein:  65-75 g/d  Fluid:  1200 mL per MD  Kim MidgetHannah Miyoshi Ligas, MS, Dietetic Intern Pager: [phone] After hours Pager: [phone]

## 2018-09-30 NOTE — Progress Notes (Signed)
NAME:  Kim Wagner, MRN:  [id number], DOB:  01-28-1956, LOS: 1 ADMISSION DATE:  09/29/2018, CONSULTATION DATE:  09/29/2018 REFERRING MD:  Frederick Peers, MD CHIEF COMPLAINT:  AMS  Brief History   Kim Wagner is a 63 y.o. female who was admitted 1/7 for acute encephalopathy felt to be due to Na 116.  History of present illness   63yo female with PMH CHF, HTN, weight loss, previous admissions for hyponatremia presenting to [facility] yesterday evening. She was in her usual state of health per daughter throughout the day. Daughter found her slumped over the commode around 1320, barely arousable and minimally responsive. Daughter denies any recent illness.  Denies fevers/chills/sweats, headaches, chest pain, dyspnea.  Head CT showed now acute abnormality. CXR Normal. Lab work was noteable for Na of 116 (acute on chronic, baseline high 120's to low 130's over the past year).  UDS was negative.  BP elevated with SBP in 190s.  On exam, she had evidence of volume overload with 2+ pitting LE edema.  She has very mild JVD; however, lung fields are clear to auscultation.  CXR without edema.  Daughter states that pt hs CHF and HTN and is not fully compliant with medications or PCP follow up.  She states "mom takes her medicine when I'm home to force her, otherwise she skips doses".  Daughter denies excessive water intake, hx of thyroid disease, hx of being on SSRI's.  She does report that pt has been admitted once previously for symptomatic hyponatremia as well as UTI.  No complications that daughter is aware of.  She reports that pt has significant smoking hx of at least 3 - 4 packs per day for past 40 years.  She was admitted in Aug 2019 for weight loss.  Had multiple CT's including head/chest/abdomen that did not suggest any underlying malignancy.  During that admission, she also had hyponatremia that was attributed to volume overload.  She also had workup for confusion including TSH and B12 that was normal.  Due to  symptomatic hyponatremia, she was started on 3% NS and PCCM was asked to evaluate for ICU admission.  Past Medical History  HTN HFrEF? (Last echo indeterminate diastolic dysfunction, EF 65%, PA pressure 57mm)   Significant Hospital Events   1/7 >> admit  Consults:  none  Procedures:  none  Significant Diagnostic Tests:  CXR 1/7 > negative. CT head 1/7 > negative. CT C-spine 1/7 >   Micro Data:  none  Antimicrobials:  none  Interim history/subjective:  Patient feeling with this am, has had cough for the past year that became much worse in July. Productive for clear sputum. Smoked 2ppd and has cut back to 11 cigs/day. States symptoms came on suddenly yesterday and she felt fine prior. Has had edema in RLE recently. States compliance with medications.   Objective   Blood pressure 104/67, pulse 77, temperature 99 F (37.2 C), temperature source Axillary, resp. rate 11, height 5\' 1"  (1.549 m), weight 52.6 kg, SpO2 (!) 85 %.        Intake/Output Summary (Last 24 hours) at 09/30/2018 0559 Last data filed at 09/30/2018 0400 Gross per 24 hour  Intake 85.25 ml  Output 1450 ml  Net -1364.75 ml   Filed Weights   09/30/18 0500  Weight: 52.6 kg     Examination: General: A&O, lying supine in bed, NAD HENT: [place] in place, AT, [place] Lungs: tracheal breath sounds, rhonchi, no wheezing or crackles Cardiovascular: RRR, no m/r/g Abdomen: soft, non-distended, NTTP  Extremities: warm, well perfused, strength 5/5 symmetrical Neuro: pupils minimally reactive, CN II-XII intact  Resolved Hospital Problem list   Encephalopathy Hypertensive Urgency   Assessment & Plan:    Hyponatremia Acute on Chronic HFpEF Na 123 <<124 2227. Admitted with Na 116. Received 80mg  IV lasix yesterday with good urine output. Overload appears decreased.   - IV lasix 40 mg IV now - Na q6h - fluid restricted diet  - goal correction .555mmol/L/hr  - afternoon Na - am BMP  - [facility]  continues to trend upward  Hypertensive urgency Resolved. BP soft today  - resume Home ramipril tomorrow  Best practice:  Diet: fluid/Na restricted Pain/Anxiety/Delirium protocol (if indicated): n/a VAP protocol (if indicated):  DVT prophylaxis: SCDs, heparin GI prophylaxis: n/a  Glucose control: n/a Mobility: nurse to assess Code Status: full Family Communication: no family at bedside Disposition: stable for transfer  Labs   CBC: Recent Labs  Lab 09/29/18 1429 09/29/18 1445  WBC 11.5*  --   NEUTROABS 9.9*  --   HGB 13.9 15.6*  HCT 39.5 46.0  MCV 84.6  --   PLT 357  --     Basic Metabolic Panel: Recent Labs  Lab 09/29/18 1429 09/29/18 1445 09/29/18 1754 09/29/18 2032 09/29/18 2227  NA 118* 116* 119* 120* 124*  K 5.0 5.0  --  3.9  --   CL 82* 81*  --   --   --   CO2 27  --   --   --   --   GLUCOSE 124* 123*  --   --   --   BUN 12 15  --   --   --   CREATININE 0.61 0.60  --   --   --   CALCIUM 9.6  --   --   --   --    GFR: Estimated Creatinine Clearance: 53.6 mL/min (by C-G formula based on SCr of 0.6 mg/dL). Recent Labs  Lab 09/29/18 1429 09/29/18 1503 09/29/18 1819  WBC 11.5*  --   --   LATICACIDVEN  --  1.39 1.46    Liver Function Tests: Recent Labs  Lab 09/29/18 1429  AST 42*  ALT 18  ALKPHOS 82  BILITOT 1.0  PROT 6.7  ALBUMIN 3.7   No results for input(s): LIPASE, AMYLASE in the last 168 hours. No results for input(s): AMMONIA in the last 168 hours.  ABG    Component Value Date/Time   PHART 7.421 09/29/2018 1705   PCO2ART 48.2 (H) 09/29/2018 1705   PO2ART 62.0 (L) 09/29/2018 1705   HCO3 31.3 (H) 09/29/2018 1705   TCO2 33 (H) 09/29/2018 1705   O2SAT 91.0 09/29/2018 1705     Coagulation Profile: Recent Labs  Lab 09/29/18 1429  INR 1.05    Cardiac Enzymes: No results for input(s): CKTOTAL, CKMB, CKMBINDEX, TROPONINI in the last 168 hours.  HbA1C: No results found for: HGBA1C  CBG: Recent Labs  Lab 09/29/18 1429  09/29/18 2031 09/30/18 0412  GLUCAP 123* 109* 94    Review of Systems:   See interim history  Past Medical History  She,  has a past medical history of Chest discomfort and History of kidney stones.   Surgical History    Past Surgical History:  Procedure Laterality Date  . ABDOMINAL HYSTERECTOMY    . CYSTOSCOPY W/ RETROGRADES Left 04/18/2013   Procedure: CYSTOSCOPY WITH LEFT RETROGRADE PYELOGRAM; URETER STENT PLACEMENT;  Surgeon: Milford Cageaniel Young Woodruff, MD;  Location:  [facility] OR;  Service: Urology;  Laterality: Left;  . CYSTOSCOPY WITH RETROGRADE PYELOGRAM, URETEROSCOPY AND STENT PLACEMENT Left 06/07/2013   Procedure: CYSTOSCOPY, BILATERAL RETROGRADE PYELOGRAM, LEFT URETEROSCOPY, LEFT LASER LITHOTRIPSY, LEFT URETER STENT EXCHANGE;  Surgeon: Milford Cage, MD;  Location: [facility]S;  Service: Urology;  Laterality: Left;  CYSTOSCOPY, BILATERAL RETROGRADE PYELOGRAM, LEFT URETEROSCOPY, LEFT LASER LITHOTRIPSY, LEFT URETER STENT EXCHANGE   . HOLMIUM LASER APPLICATION Left 06/07/2013   Procedure: HOLMIUM LASER APPLICATION;  Surgeon: Milford Cage, MD;  Location: [facility]S;  Service: Urology;  Laterality: Left;  . right ankle surgery    . SHOULDER SURGERY Left      Social History   reports that she has been smoking cigarettes. She has a 120.00 pack-year smoking history. She has never used smokeless tobacco. She reports that she does not drink alcohol or use drugs.   Family History   Her family history includes CAD (age of onset: 76) in her brother; CAD (age of onset: 26) in her brother; CAD (age of onset: 58) in her mother; Stroke in an other family member.   Allergies Allergies  Allergen Reactions  . Penicillins Rash    Has patient had a PCN reaction causing immediate rash, facial/tongue/throat swelling, SOB or lightheadedness with hypotension: Yes Has patient had a PCN reaction causing severe rash involving mucus membranes or skin necrosis: Unk Has patient had a PCN reaction that  required hospitalization: No Has patient had a PCN reaction occurring within the last 10 years: No If all of the above answers are "NO", then may proceed with Cephalosporin use.      Home Medications  Prior to Admission medications   Medication Sig Start Date End Date Taking? Authorizing Provider  ferrous sulfate 325 (65 FE) MG tablet Take 1 tablet (325 mg total) by mouth 2 (two) times daily with a meal. 05/26/18  Yes Kathlen Mody, MD  furosemide (LASIX) 40 MG tablet Take 1 tablet (40 mg total) by mouth daily. 05/27/18  Yes Kathlen Mody, MD  gabapentin (NEURONTIN) 300 MG capsule Take 300 mg by mouth at bedtime.  03/12/18  Yes [provider]  aspirin EC 81 MG tablet Take 81 mg by mouth at bedtime.    [provider]  feeding supplement, ENSURE ENLIVE, (ENSURE ENLIVE) LIQD Take 237 mLs by mouth 2 (two) times daily between meals. Patient not taking: Reported on 09/29/2018 05/26/18   Kathlen Mody, MD  Multiple Vitamins-Minerals (CENTRUM WOMEN) TABS Take 1 tablet by mouth daily.    [provider]  ramipril (ALTACE) 1.25 MG capsule Take 1 capsule (1.25 mg total) by mouth 2 (two) times daily. Patient not taking: Reported on 09/29/2018 05/26/18   Kathlen Mody, MD  thiamine 100 MG tablet Take 1 tablet (100 mg total) by mouth daily. Patient not taking: Reported on 09/29/2018 05/27/18   Kathlen Mody, MD  vitamin B-12 (CYANOCOBALAMIN) 500 MCG tablet Take 1 tablet (500 mcg total) by mouth daily. Patient not taking: Reported on 09/29/2018 05/27/18   Kathlen Mody, MD            Versie Starks, DO 09/30/2018, 5:59 AM Pager: [phone]

## 2018-10-01 DIAGNOSIS — I10 Essential (primary) hypertension: Secondary | ICD-10-CM

## 2018-10-01 LAB — BASIC METABOLIC PANEL
Anion gap: 10 (ref 5–15)
BUN: 14 mg/dL (ref 8–23)
CO2: 28 mmol/L (ref 22–32)
Calcium: 8.9 mg/dL (ref 8.9–10.3)
Chloride: 86 mmol/L — ABNORMAL LOW (ref 98–111)
Creatinine, Ser: 0.55 mg/dL (ref 0.44–1.00)
GFR calc Af Amer: >60 mL/min (ref >60)
GFR calc non Af Amer: >60 mL/min (ref >60)
Glucose, Bld: 92 mg/dL (ref 70–99)
Potassium: 3.5 mmol/L (ref 3.5–5.1)
Sodium: 124 mmol/L — ABNORMAL LOW (ref 135–145)

## 2018-10-01 LAB — CBC
HCT: 37.4 % (ref 36.0–46.0)
Hemoglobin: 12.8 g/dL (ref 12.0–15.0)
MCH: 29.4 pg (ref 26.0–34.0)
MCHC: 34.2 g/dL (ref 30.0–36.0)
MCV: 85.8 fL (ref 80.0–100.0)
Platelets: 275 10*3/uL (ref 150–400)
RBC: 4.36 MIL/uL (ref 3.87–5.11)
RDW: 12.5 % (ref 11.5–15.5)
WBC: 9.9 10*3/uL (ref 4.0–10.5)
nRBC: 0 % (ref 0.0–0.2)

## 2018-10-01 LAB — SODIUM
Sodium: 125 mmol/L — ABNORMAL LOW (ref 135–145)
Sodium: 128 mmol/L — ABNORMAL LOW (ref 135–145)

## 2018-10-01 MED ORDER — RAMIPRIL 1.25 MG PO CAPS
1.2500 mg | ORAL_CAPSULE | Freq: Every day | ORAL | Status: DC
  Administered 2018-10-01: 1.25 mg via ORAL
  Filled 2018-10-01 (×3): qty 1

## 2018-10-01 MED ORDER — FUROSEMIDE 20 MG PO TABS: 20.0000 mg | ORAL_TABLET | Freq: Every day | ORAL | Status: DC

## 2018-10-01 NOTE — Progress Notes (Signed)
PROGRESS NOTE    Kim Wagner  ZOX:[id number]  DOB: February 25, 1956  DOA: 09/29/2018 PCP: Hadley Penobbins, Robert A, MD  Brief Narrative:  63 year old smoker with chronic diastolic heart failure admitted with severe hyponatremia and confusion, sodium 116. Treated with 3% saline and diuresed with Lasix with improvement in sodium to 125 today, 3% saline was held when sodium improved to 120 on 1/7 and transferred out of ICU on January 8.  Patient overnight was hypotensive with systolic dropping to 70.  Lasix was held and patient received 500 mL normal saline.   Subjective: Patient awake alert oriented x3 this evening.  Her blood pressure is now elevated to systolic 150s.  She is on 2 L nasal cannula saturating well.  Patient's baseline sodium level appears to be around 1 29-1 31  Objective: Vitals:   10/01/18 1541 10/01/18 1600 10/01/18 1700 10/01/18 1800  BP:  138/88 (!) 146/97 (!) 152/104  Pulse:  85 79 78  Resp:  (!) 25 10 17   Temp: 98.2 F (36.8 C)     TempSrc: Oral     SpO2:  100% 100% 100%  Weight:      Height:        Intake/Output Summary (Last 24 hours) at 10/01/2018 1927 Last data filed at 10/01/2018 1600 Gross per 24 hour  Intake 1426.1 ml  Output 130 ml  Net 1296.1 ml   Filed Weights   09/30/18 0500 10/01/18 0500  Weight: 52.6 kg 52.6 kg    Physical Examination:  General exam: Appears calm and comfortable  Respiratory system: + scoliosis, clear to auscultation. Respiratory effort normal. Cardiovascular system: S1 & S2 heard, RRR. No JVD, murmurs, rubs, gallops or clicks. No pedal edema. Gastrointestinal system: Abdomen is nondistended, soft and nontender. No organomegaly or masses felt. Normal bowel sounds heard. Central nervous system: Alert and oriented. No focal neurological deficits. Extremities: Symmetric 5 x 5 power. Skin: No rashes, lesions or ulcers Psychiatry: Judgement and insight appear normal. Mood & affect appropriate.     Data Reviewed: I have personally  reviewed following labs and imaging studies  CBC: Recent Labs  Lab 09/29/18 1429 09/29/18 1445 09/30/18 0545 10/01/18 0645  WBC 11.5*  --  11.1* 9.9  NEUTROABS 9.9*  --   --   --   HGB 13.9 15.6* 12.9 12.8  HCT 39.5 46.0 37.7 37.4  MCV 84.6  --  84.0 85.8  PLT 357  --  296 275   Basic Metabolic Panel: Recent Labs  Lab 09/29/18 1429 09/29/18 1445  09/29/18 2032  09/30/18 0545 09/30/18 1314 09/30/18 2015 10/01/18 0157 10/01/18 0645 10/01/18 1400  NA 118* 116*   < > 120*   < > 123* 125* 125* 125* 124* 128*  K 5.0 5.0  --  3.9  --  3.5  --   --   --  3.5  --   CL 82* 81*  --   --   --  84*  --   --   --  86*  --   CO2 27  --   --   --   --  28  --   --   --  28  --   GLUCOSE 124* 123*  --   --   --  79  --   --   --  92  --   BUN 12 15  --   --   --  10  --   --   --  14  --  CREATININE 0.61 0.60  --   --   --  0.48  --   --   --  0.55  --   CALCIUM 9.6  --   --   --   --  9.1  --   --   --  8.9  --   MG  --   --   --   --   --  1.7  --   --   --   --   --   PHOS  --   --   --   --   --  3.8  --   --   --   --   --    < > = values in this interval not displayed.   GFR: Estimated Creatinine Clearance: 53.6 mL/min (by C-G formula based on SCr of 0.55 mg/dL). Liver Function Tests: Recent Labs  Lab 09/29/18 1429  AST 42*  ALT 18  ALKPHOS 82  BILITOT 1.0  PROT 6.7  ALBUMIN 3.7   No results for input(s): LIPASE, AMYLASE in the last 168 hours. No results for input(s): AMMONIA in the last 168 hours. Coagulation Profile: Recent Labs  Lab 09/29/18 1429  INR 1.05   Cardiac Enzymes: No results for input(s): CKTOTAL, CKMB, CKMBINDEX, TROPONINI in the last 168 hours. BNP (last 3 results) No results for input(s): PROBNP in the last 8760 hours. HbA1C: No results for input(s): HGBA1C in the last 72 hours. CBG: Recent Labs  Lab 09/29/18 2031 09/30/18 0412 09/30/18 0715 09/30/18 1125 09/30/18 1516  GLUCAP 109* 94 85 94 145*   Lipid Profile: No results for  input(s): CHOL, HDL, LDLCALC, TRIG, CHOLHDL, LDLDIRECT in the last 72 hours. Thyroid Function Tests: Recent Labs    09/29/18 1754  TSH 0.384   Anemia Panel: No results for input(s): VITAMINB12, FOLATE, FERRITIN, TIBC, IRON, RETICCTPCT in the last 72 hours. Sepsis Labs: Recent Labs  Lab 09/29/18 1503 09/29/18 1819  LATICACIDVEN 1.39 1.46    Recent Results (from the past 240 hour(s))  MRSA PCR Screening     Status: None   Collection Time: 09/29/18  8:23 PM  Result Value Ref Range Status   MRSA by PCR NEGATIVE NEGATIVE Final    Comment:        The GeneXpert MRSA Assay (FDA approved for NASAL specimens only), is one component of a comprehensive MRSA colonization surveillance program. It is not intended to diagnose MRSA infection nor to guide or monitor treatment for MRSA infections. Performed at [facility], [address] [place], [place] [address]       Radiology Studies: No results found.      Scheduled Meds: . feeding supplement (ENSURE ENLIVE)  237 mL Oral BID BM  . heparin  5,000 Units Subcutaneous Q8H  . ipratropium-albuterol  3 mL Nebulization TID  . mouth rinse  15 mL Mouth Rinse BID  . nicotine  21 mg Transdermal Daily  . ramipril  1.25 mg Oral Daily   Continuous Infusions:  Assessment & Plan:    1.  Hypervolemic hyponatremia: Treated with hypertonic saline initially and then with Lasix.  Sodium level has gradually improved.  Her baseline is around 1 29-1 30.  Lasix was held yesterday due to episode of hypotension.  Will resume at lower dose.  2.Hypertension: On Lasix/ACE inhibitor at home.  Received IV fluid bolus for hypotension yesterday.       Resume ramipril and lower dose of Lasix in a.m.  3.  Acute on chronic diastolic CHF: Resume ACE inhibitor/diuretics as described above.  Taper O2 to off.  4. COPD: Stable with no wheezing.  Does not use oxygen at home.  On neb treatments as needed and nicotine patch, counseled to quit  smoking.     DVT prophylaxis: Heparin Code Status: Full code Family / Patient Communication: Discussed with patient and all questions answered. Disposition Plan: Downgrade to telemetry.     LOS: 2 days    Time spent: 35 minutes    Alessandra Bevels, MD [facility] Pager [phone]  If 7PM-7AM, please contact night-coverage [email or web address] Password [facility] 10/01/2018, 7:27 PM

## 2018-10-02 ENCOUNTER — Inpatient Hospital Stay (HOSPITAL_COMMUNITY): Payer: BLUE CROSS/BLUE SHIELD

## 2018-10-02 DIAGNOSIS — G9341 Metabolic encephalopathy: Secondary | ICD-10-CM

## 2018-10-02 LAB — BASIC METABOLIC PANEL
Anion gap: 9 (ref 5–15)
Anion gap: 9 (ref 5–15)
BUN: 12 mg/dL (ref 8–23)
BUN: 9 mg/dL (ref 8–23)
CALCIUM: 9.2 mg/dL (ref 8.9–10.3)
CO2: 28 mmol/L (ref 22–32)
CO2: 30 mmol/L (ref 22–32)
Calcium: 9.2 mg/dL (ref 8.9–10.3)
Chloride: 88 mmol/L — ABNORMAL LOW (ref 98–111)
Chloride: 89 mmol/L — ABNORMAL LOW (ref 98–111)
Creatinine, Ser: 0.54 mg/dL (ref 0.44–1.00)
Creatinine, Ser: 0.53 mg/dL (ref 0.44–1.00)
GFR calc Af Amer: >60 mL/min (ref >60)
GFR calc Af Amer: >60 mL/min (ref >60)
GFR calc non Af Amer: >60 mL/min (ref >60)
GFR calc non Af Amer: >60 mL/min (ref >60)
Glucose, Bld: 103 mg/dL — ABNORMAL HIGH (ref 70–99)
Glucose, Bld: 112 mg/dL — ABNORMAL HIGH (ref 70–99)
Potassium: 3.8 mmol/L (ref 3.5–5.1)
Potassium: 3.5 mmol/L (ref 3.5–5.1)
SODIUM: 128 mmol/L — AB (ref 135–145)
Sodium: 125 mmol/L — ABNORMAL LOW (ref 135–145)

## 2018-10-02 LAB — GLUCOSE, CAPILLARY: Glucose-Capillary: 137 mg/dL — ABNORMAL HIGH (ref 70–99)

## 2018-10-02 LAB — SODIUM: Sodium: 126 mmol/L — ABNORMAL LOW (ref 135–145)

## 2018-10-02 MED ORDER — LORAZEPAM 2 MG/ML IJ SOLN
0.5000 mg | Freq: Once | INTRAMUSCULAR | Status: AC
  Administered 2018-10-02: 0.5 mg via INTRAVENOUS

## 2018-10-02 MED ORDER — HYDRALAZINE HCL 20 MG/ML IJ SOLN
10.0000 mg | INTRAMUSCULAR | Status: DC | PRN
  Administered 2018-10-03 (×2): 10 mg via INTRAVENOUS
  Filled 2018-10-02 (×2): qty 1

## 2018-10-02 MED ORDER — FUROSEMIDE 20 MG PO TABS
20.0000 mg | ORAL_TABLET | Freq: Every day | ORAL | Status: DC
  Administered 2018-10-02 – 2018-10-03 (×2): 20 mg via ORAL
  Filled 2018-10-02: qty 1

## 2018-10-02 MED ORDER — LORAZEPAM 2 MG/ML IJ SOLN
INTRAMUSCULAR | Status: AC
  Filled 2018-10-02: qty 1

## 2018-10-02 MED ORDER — TRAZODONE HCL 50 MG PO TABS
50.0000 mg | ORAL_TABLET | Freq: Once | ORAL | Status: AC
  Administered 2018-10-02: 50 mg via ORAL
  Filled 2018-10-02: qty 1

## 2018-10-02 MED ORDER — JEVITY 1.2 CAL PO LIQD
1000.0000 mL | ORAL | Status: DC
  Administered 2018-10-02 – 2018-10-05 (×3): 1000 mL
  Filled 2018-10-02 (×5): qty 1000

## 2018-10-02 MED ORDER — SODIUM CHLORIDE 1 G PO TABS
2.0000 g | ORAL_TABLET | Freq: Three times a day (TID) | ORAL | Status: DC
  Administered 2018-10-02 – 2018-10-03 (×3): 2 g via ORAL
  Filled 2018-10-02 (×5): qty 2

## 2018-10-02 NOTE — Plan of Care (Signed)
  Problem: Education: Goal: Knowledge of General Education information will improve Description Including pain rating scale, medication(s)/side effects and non-pharmacologic comfort measures Outcome: Not Progressing   Problem: Health Behavior/Discharge Planning: Goal: Ability to manage health-related needs will improve Outcome: Not Progressing   Problem: Clinical Measurements: Goal: Ability to maintain clinical measurements within normal limits will improve Outcome: Not Progressing Goal: Will remain free from infection Outcome: Not Progressing Goal: Diagnostic test results will improve Outcome: Not Progressing Goal: Respiratory complications will improve Outcome: Not Progressing Goal: Cardiovascular complication will be avoided Outcome: Not Progressing   Problem: Activity: Goal: Risk for activity intolerance will decrease Outcome: Not Progressing   Problem: Nutrition: Goal: Adequate nutrition will be maintained Outcome: Not Progressing   Problem: Coping: Goal: Level of anxiety will decrease Outcome: Not Progressing   Problem: Elimination: Goal: Will not experience complications related to bowel motility Outcome: Not Progressing Goal: Will not experience complications related to urinary retention Outcome: Not Progressing   Problem: Pain Managment: Goal: General experience of comfort will improve Outcome: Not Progressing   Problem: Pain Managment: Goal: General experience of comfort will improve Outcome: Not Progressing   Problem: Safety: Goal: Ability to remain free from injury will improve Outcome: Not Progressing   Problem: Skin Integrity: Goal: Risk for impaired skin integrity will decrease Outcome: Not Progressing

## 2018-10-02 NOTE — Evaluation (Signed)
Clinical/Bedside Swallow Evaluation Patient Details  Name: Kim Wagner MRN: [id number] Date of Birth: 10-21-1955  Today's Date: 10/02/2018 Time: SLP Start Time (ACUTE ONLY): 1150 SLP Stop Time (ACUTE ONLY): 1225 SLP Time Calculation (min) (ACUTE ONLY): 35 min  Past Medical History:  Past Medical History:  Diagnosis Date  . Acute on chronic diastolic (congestive) heart failure (HCC)   . Chest discomfort    ONLY "IF RUNNING TO PLAY WITH GRANDCHILDREN" - PT IS A SMOKER AND HAS STRONG FAMILY HX OF HEART PROBLEMS AND WILL HAVE NUCLEAR STRESS TEST TUESDAY 06/01/13 FOR CLEARANCE FOR PLANNED URETEROSCOPY, LASER LITHO SURGERY ON 9/15.  Marland Kitchen. History of kidney stones    Past Surgical History:  Past Surgical History:  Procedure Laterality Date  . ABDOMINAL HYSTERECTOMY    . CYSTOSCOPY W/ RETROGRADES Left 04/18/2013   Procedure: CYSTOSCOPY WITH LEFT RETROGRADE PYELOGRAM; URETER STENT PLACEMENT;  Surgeon: Kim Cageaniel Young Woodruff, MD;  [facility];  Service: Urology;  Laterality: Left;  . CYSTOSCOPY WITH RETROGRADE PYELOGRAM, URETEROSCOPY AND STENT PLACEMENT Left 06/07/2013   Procedure: CYSTOSCOPY, BILATERAL RETROGRADE PYELOGRAM, LEFT URETEROSCOPY, LEFT LASER LITHOTRIPSY, LEFT URETER STENT EXCHANGE;  Surgeon: Kim Cageaniel Young Woodruff, MD;  Location: [facility];  Service: Urology;  Laterality: Left;  CYSTOSCOPY, BILATERAL RETROGRADE PYELOGRAM, LEFT URETEROSCOPY, LEFT LASER LITHOTRIPSY, LEFT URETER STENT EXCHANGE   . HOLMIUM LASER APPLICATION Left 06/07/2013   Procedure: HOLMIUM LASER APPLICATION;  Surgeon: Kim Cageaniel Young Woodruff, MD;  Location: [facility];  Service: Urology;  Laterality: Left;  . right ankle surgery    . SHOULDER SURGERY Left    HPI:  Pt is a 63 year old female history of diastolic HF, nephrolithiasis, hypertension who was brought in to ED secondary to confusion and minimal responsiveness.  She was found to be severely hyponatremic with volume overload causing acute encephalopathy. Chest x-ray and CT of  the head of 09/29/18 were negative. SLP was consulted due to pt demonstrating coughing with thin liquids.    Assessment / Plan / Recommendation Clinical Impression  Pt was seen in room for bedside swallow evaluation secondary to nursing observing signs of aspiration with thin liquids. Pt demontrated confusion and reduced attention throughout the evaluation but she was able to follow single-step commands to participate in evaluation. She presents with symptoms of oropharyngeal dysphagia characterized by oral holding, prolonged mastication, and signs of aspiration with thin liquids. A modified barium swallow study is recommended to further assess swallow function and determine the safest p.o. diet. The study is scheduled for 1400 and it is recommended that p.o. intake be deferred until it is completed. Meds may be crushed and administered in puree (e.g., applesauce).  SLP Visit Diagnosis: Dysphagia, oropharyngeal phase (R13.12)           Diet Recommendation NPO except meds   Medication Administration: Crushed with puree Postural Changes: Seated upright at 90 degrees    Other  Recommendations Oral Care Recommendations: Oral care BID   Follow up Recommendations 24 hour supervision/assistance      Frequency and Duration min 2x/week  2 weeks       Prognosis Prognosis for Safe Diet Advancement: Good Barriers to Reach Goals: Cognitive deficits      Swallow Study   General Date of Onset: 10/02/18 HPI: Pt is a 63 year old female history of diastolic HF, nephrolithiasis, hypertension who was brought in to ED secondary to confusion and minimal responsiveness.  She was found to be severely hyponatremic with volume overload causing acute encephalopathy. Chest x-ray and CT of the head of 09/29/18  were negative. SLP was consulted due to pt demonstrating coughing with thin liquids.  Type of Study: Bedside Swallow Evaluation Previous Swallow Assessment: None Diet Prior to this Study: Regular History of  Recent Intubation: No Behavior/Cognition: Confused;Pleasant mood;Cooperative;Distractible Oral Cavity Assessment: Dried secretions Oral Care Completed by SLP: Yes Oral Cavity - Dentition: Adequate natural dentition Self-Feeding Abilities: Needs assist Patient Positioning: Upright in bed Baseline Vocal Quality: Normal Volitional Cough: Strong;Wet Volitional Swallow: Able to elicit    Oral/Motor/Sensory Function Overall Oral Motor/Sensory Function: Within functional limits   Ice Chips Ice chips: Within functional limits Presentation: Spoon   Thin Liquid Thin Liquid: Impaired Presentation: Cup;Spoon;Straw Pharyngeal  Phase Impairments: Wet Vocal Quality;Cough - Immediate;Suspected delayed Swallow Other Comments: Coughing was noted with consecutive swallows of thin liquids via cup and with all trials via straw. No s/sx of aspiration noted with ice chips.      Nectar Thick Nectar Thick Liquid: Not tested   Honey Thick Honey Thick Liquid: Not tested   Puree Puree: Impaired Presentation: Spoon Oral Phase Functional Implications: Oral holding(Pt required cues to swallow bolus )   Solid     Solid: Impaired Presentation: Spoon Oral Phase Impairments: Impaired mastication(Moderately prolonged mastication noted )      Kim Wagner 10/02/2018,1:59 PM  Kim Neu I. Vear Clock, MS, CCC-SLP Acute Rehabilitation Services Office number [phone] Pager [phone]

## 2018-10-02 NOTE — Procedures (Signed)
Cortrak  Person Inserting Tube:  Kendell Bane C, RD Tube Type:  Cortrak - 43 inches Tube Location:  Right nare Initial Placement:  Stomach Secured by: Bridle Technique Used to Measure Tube Placement:  Documented cm marking at nare/ corner of mouth Cortrak Secured At:  64 cm    Cortrak Tube Team Note:  Consult received to place a Cortrak feeding tube.  Sitter and SLP at bedside during placement.   No x-ray is required. RN may begin using tube.   If the tube becomes dislodged please keep the tube and contact the Cortrak team at [email or web address] (password [email or web address]) for replacement.  If after hours and replacement cannot be delayed, place a NG tube and confirm placement with an abdominal x-ray.    Kendell Bane RD, LDN, CNSC [phone] Pager [phone] After Hours Pager

## 2018-10-02 NOTE — Progress Notes (Addendum)
PROGRESS NOTE   Kim Wagner  VVO:[id number]    DOB: 1956/03/02    DOA: 09/29/2018  PCP: Hadley Pen, MD   I have briefly reviewed patients previous medical records in [facility].  Brief Narrative:  63 year old female with PMH of Chronic diastolic heart failure, nephrolithiasis, HTN, was brought to the hospital on 09/29/2018 with severe hyponatremia (sodium 116) and confusion.  She was admitted by CCM, treated with 3% saline and diuresed with Lasix with improvement in sodium.  She was transferred to [facility] 1/9.  Still confused.  Assessment & Plan:   Principal Problem:   Hyponatremia Active Problems:   Acute encephalopathy   Malnutrition of moderate degree   Acute on chronic diastolic (congestive) heart failure (HCC)   Hyponatremia/SIADH -As per chart review, patient has been admitted previously for symptomatic hyponatremia. -Presented with sodium of 116.  Baseline sodium in the high 120s-low 130s over the past year. -This is acute on chronic hyponatremia. -She was clinically volume overloaded on admission with 2+ pitting lower extremity edema, very mild JVD but chest x-ray without edema. -No history of excessive water intake.  No history of SSRI use.  TSH normal. -Reportedly had multiple CTs including head, chest and abdomen that did not suggest underlying malignancy. -Treated briefly with 3% normal saline and IV Lasix. -Sodium improved.  Hypertonic saline discontinued. -She currently has been on Lasix 20 mg orally daily, sodiums in the last 24 hours have ranged in the mid 120s. -Urine sodium 43, urine osmolarity 387, serum osmolarity 252 suggestive of SIADH. -Periphery does not have any excess volume. -I discussed in detail with nephrologist on 1/10 and as per recommendations, continue Lasix 20 mg daily, added sodium chloride tablets 2 g 3 times daily, continue fluid restriction. -Follow BMP closely.  Acute on chronic diastolic CHF. -Now without peripheral excess volume.   Continue Lasix 20 mg daily.  Hypertensive urgency -Discontinued low-dose ACEI initiated yesterday until sodium has stabilized.  Continue PRN IV hydralazine.  May consider starting amlodipine if blood pressures not controlled.  Acute metabolic encephalopathy -Due to hyponatremia on admission.  Patient alert and oriented x2 but still pleasantly confused.  Need to establish baseline with family. -Currently on wrist restraints and safety sitter added. -Monitor closely.  COPD/tobacco abuse:  - Stable without clinical bronchospasm. -Needs to quit smoking.   DVT prophylaxis: Subcutaneous heparin Code Status: Full Family Communication: None at bedside Disposition: To be determined pending further clinical improvement.   Consultants:  CCM-signed off  Procedures:  None  Antimicrobials:  None   Subjective: Patient interviewed and examined with her female RN in room.  Pleasantly confused.  Oriented to self and place.  Poor historian.  As per RN, may have difficulty with swallowing and hence speech therapy evaluation requested.  ROS: As above, or unable due to mental status changes.  Objective:  Vitals:   10/02/18 0500 10/02/18 0753 10/02/18 0850 10/02/18 1156  BP:  (!) 161/97  (!) 141/101  Pulse:      Resp: 18 13  15   Temp:  98.5 F (36.9 C)  99 F (37.2 C)  TempSrc:  Oral  Oral  SpO2:   96%   Weight: 53 kg     Height:        Examination:  General exam: Pleasant middle-aged female, moderately built, thinly nourished, lying comfortably propped up in bed. Respiratory system: Clear to auscultation. Respiratory effort normal. Cardiovascular system: S1 & S2 heard, RRR. No JVD, murmurs, rubs, gallops or clicks. No  pedal edema.  Telemetry personally reviewed: Sinus rhythm. Gastrointestinal system: Abdomen is nondistended, soft and nontender. No organomegaly or masses felt. Normal bowel sounds heard. Central nervous system: Alert and oriented x2.  Answers inappropriately at  times. No focal neurological deficits. Extremities: Symmetric 5 x 5 power. Skin: No rashes, lesions or ulcers Psychiatry: Judgement and insight impaired. Mood & affect appropriate.     Data Reviewed: I have personally reviewed following labs and imaging studies  CBC: Recent Labs  Lab 09/29/18 1429 09/29/18 1445 09/30/18 0545 10/01/18 0645  WBC 11.5*  --  11.1* 9.9  NEUTROABS 9.9*  --   --   --   HGB 13.9 15.6* 12.9 12.8  HCT 39.5 46.0 37.7 37.4  MCV 84.6  --  84.0 85.8  PLT 357  --  296 275   Basic Metabolic Panel: Recent Labs  Lab 09/29/18 1429 09/29/18 1445  09/29/18 2032  09/30/18 0545  10/01/18 0157 10/01/18 0645 10/01/18 1400 10/01/18 2257 10/02/18 0719  NA 118* 116*   < > 120*   < > 123*   < > 125* 124* 128* 126* 125*  K 5.0 5.0  --  3.9  --  3.5  --   --  3.5  --   --  3.8  CL 82* 81*  --   --   --  84*  --   --  86*  --   --  88*  CO2 27  --   --   --   --  28  --   --  28  --   --  28  GLUCOSE 124* 123*  --   --   --  79  --   --  92  --   --  103*  BUN 12 15  --   --   --  10  --   --  14  --   --  12  CREATININE 0.61 0.60  --   --   --  0.48  --   --  0.55  --   --  0.54  CALCIUM 9.6  --   --   --   --  9.1  --   --  8.9  --   --  9.2  MG  --   --   --   --   --  1.7  --   --   --   --   --   --   PHOS  --   --   --   --   --  3.8  --   --   --   --   --   --    < > = values in this interval not displayed.   Liver Function Tests: Recent Labs  Lab 09/29/18 1429  AST 42*  ALT 18  ALKPHOS 82  BILITOT 1.0  PROT 6.7  ALBUMIN 3.7   Coagulation Profile: Recent Labs  Lab 09/29/18 1429  INR 1.05   CBG: Recent Labs  Lab 09/29/18 2031 09/30/18 0412 09/30/18 0715 09/30/18 1125 09/30/18 1516  GLUCAP 109* 94 85 94 145*    Recent Results (from the past 240 hour(s))  MRSA PCR Screening     Status: None   Collection Time: 09/29/18  8:23 PM  Result Value Ref Range Status   MRSA by PCR NEGATIVE NEGATIVE Final    Comment:        The GeneXpert  MRSA Assay (FDA approved  for NASAL specimens only), is one component of a comprehensive MRSA colonization surveillance program. It is not intended to diagnose MRSA infection nor to guide or monitor treatment for MRSA infections. Performed at [facility], [address] [place], [place] [address]          Radiology Studies: No results found.      Scheduled Meds: . feeding supplement (ENSURE ENLIVE)  237 mL Oral BID BM  . furosemide  20 mg Oral Daily  . heparin  5,000 Units Subcutaneous Q8H  . ipratropium-albuterol  3 mL Nebulization TID  . mouth rinse  15 mL Mouth Rinse BID  . nicotine  21 mg Transdermal Daily  . sodium chloride  2 g Oral TID WC   Continuous Infusions:   LOS: 3 days     Marcellus ScottAnand Evian Salguero, MD, FACP, [facility]. [facility] Pager [phone]  If 7PM-7AM, please contact night-coverage [email or web address] Password [email or web address] 10/02/2018, 12:07 PM

## 2018-10-02 NOTE — Progress Notes (Addendum)
Nutrition Follow-up  DOCUMENTATION CODES:   Not applicable  INTERVENTION:   -D/c Ensure Enlive po BID, each supplement provides 350 kcal and 20 grams of protein  -Initiate Jevity 1.2 @ 20 ml/hr via cortrak tube and increase by 10 ml every 4 hours to goal rate of 50 ml/hr.   Tube feeding regimen provides 1440 kcal (100% of needs), 67 grams of protein, and 968 ml of H2O.   NUTRITION DIAGNOSIS:   Increased nutrient needs related to chronic illness(CHF, smoker, thyroid disease) as evidenced by estimated needs.  Ongoing  GOAL:   Patient will meet greater than or equal to 90% of their needs  Progressing  MONITOR:   Diet advancement, Labs, Weight trends, Skin, TF tolerance, I & O's  REASON FOR ASSESSMENT:   Consult Enteral/tube feeding initiation and management  ASSESSMENT:   63 y.o. female with PMH: CHF, thyroid disease, smoker and nephrolithiasis. Admitted to ED severely hyponatremic with volume overload causing acute encephalopathy. Recent [facility] admit in August 2019 for weight loss.   1/10- pt coughing on clear liquids and transitioned to NPO; s/p BSE- recommend NPO; s/p MBSS- recommend NPO; s/p cortrak placement- tip of tube in stomach; TF initiated   Reviewed I/O's: +805 ml x 24 hours and +61 ml since admission  Pt pleasantly confused at time of visit.   Case discussed with RN, who reports pt failed MBSS and plan for cortrak placement. TF to start one access is obtained.   Labs reviewed: Na: 125.   Diet Order:   Diet Order            Diet regular Room service appropriate? Yes; Fluid consistency: Thin; Fluid restriction: 1200 mL Fluid  Diet effective now              EDUCATION NEEDS:   Not appropriate for education at this time  Skin:  Skin Assessment: Reviewed RN Assessment  Last BM:  PTA  Height:   Ht Readings from Last 1 Encounters:  09/29/18 5\' 1"  (1.549 m)    Weight:   Wt Readings from Last 1 Encounters:  10/02/18 53 kg    Ideal Body  Weight:  47 kg  BMI:  Body mass index is 22.08 kg/m.  Estimated Nutritional Needs:   Kcal:  1300-1500 kcal/d   Protein:  65-75 g/d  Fluid:  1200 mL per MD    Domenik Trice A. Mayford Knife, RD, LDN, CDE Pager: [phone] After hours Pager: [phone]

## 2018-10-02 NOTE — Progress Notes (Signed)
Bladder scan for zero. New purewick placed. CTM  Pt not tolerating thin liquids, coughing. Instructed patient to clear her throat, after cleared patient still coughing with sips. Congested. MD made aware, speech c/s placed. NPO for now. Aspiration precautions at all times.

## 2018-10-02 NOTE — Progress Notes (Signed)
Modified Barium Swallow Progress Note  Patient Details  Name: Kim Wagner MRN: [id number] Date of Birth: 1956/06/08  Today's Date: 10/02/2018  Modified Barium Swallow completed.  Full report located under Chart Review in the Imaging Section.  Brief recommendations include the following:  Clinical Impression  Pt presents with a likely transient dysphagia related to encephalopathy - however, degree of dysphagia was surprisingly severe given lack of other comorbidities.  Pt presented with adequate oral phase, but swallow response time was consistently delayed, allowing all POs - purees, nectar-thick liquids, and thin liquids - to enter the laryngeal vestibule and transition to vocal folds prior to execution of laryngeal vestibule closure.  When aspiration occurred, pt generally did not sense its presence, and a spontaneous cough was not elicited.  Pt required cues to cough, which did not eject aspirate from trachea.  Pt unable to follow commands for various compensatory strategies.  Given high risk of aspiration of all consistencies, recommend NPO with cortrak for now.  Allow ice chips after oral care. SLP will follow for readiness for repeat evaluation. Anticipate concomitant improvement in swallowing as mentation clears. D/W Dr. Waymon Amato.    Swallow Evaluation Recommendations       SLP Diet Recommendations: NPO;Alternative means - temporary;Ice chips PRN after oral care       Medication Administration: Via alternative means                      Cordelia Bessinger L. Samson Frederic, MA CCC/SLP Acute Rehabilitation Services Office number [phone] Pager [phone]   [name] 10/02/2018,3:14 PM

## 2018-10-03 LAB — GLUCOSE, CAPILLARY
Glucose-Capillary: 129 mg/dL — ABNORMAL HIGH (ref 70–99)
Glucose-Capillary: 129 mg/dL — ABNORMAL HIGH (ref 70–99)
Glucose-Capillary: 128 mg/dL — ABNORMAL HIGH (ref 70–99)
Glucose-Capillary: 132 mg/dL — ABNORMAL HIGH (ref 70–99)

## 2018-10-03 LAB — BASIC METABOLIC PANEL
Anion gap: 7 (ref 5–15)
BUN: 13 mg/dL (ref 8–23)
CO2: 31 mmol/L (ref 22–32)
Calcium: 9.6 mg/dL (ref 8.9–10.3)
Chloride: 90 mmol/L — ABNORMAL LOW (ref 98–111)
Creatinine, Ser: 0.6 mg/dL (ref 0.44–1.00)
GFR calc Af Amer: >60 mL/min (ref >60)
GFR calc non Af Amer: >60 mL/min (ref >60)
Glucose, Bld: 124 mg/dL — ABNORMAL HIGH (ref 70–99)
Potassium: 3.6 mmol/L (ref 3.5–5.1)
Sodium: 128 mmol/L — ABNORMAL LOW (ref 135–145)

## 2018-10-03 LAB — HEPATIC FUNCTION PANEL
ALT: 16 U/L (ref 0–44)
AST: 22 U/L (ref 15–41)
Albumin: 3.2 g/dL — ABNORMAL LOW (ref 3.5–5.0)
Alkaline Phosphatase: 65 U/L (ref 38–126)
Bilirubin, Direct: <0.1 mg/dL (ref 0.0–0.2)
Total Bilirubin: 0.4 mg/dL (ref 0.3–1.2)
Total Protein: 6.4 g/dL — ABNORMAL LOW (ref 6.5–8.1)

## 2018-10-03 LAB — VITAMIN B12: Vitamin B-12: 365 pg/mL (ref 180–914)

## 2018-10-03 MED ORDER — VITAMIN B-12 1000 MCG PO TABS
1000.0000 ug | ORAL_TABLET | Freq: Every day | ORAL | Status: DC
  Administered 2018-10-03 – 2018-10-06 (×4): 1000 ug
  Filled 2018-10-03 (×4): qty 1

## 2018-10-03 MED ORDER — SODIUM CHLORIDE 1 G PO TABS
2.0000 g | ORAL_TABLET | Freq: Three times a day (TID) | ORAL | Status: DC
  Filled 2018-10-03 (×2): qty 2

## 2018-10-03 MED ORDER — THIAMINE HCL 100 MG/ML IJ SOLN
500.0000 mg | Freq: Every day | INTRAVENOUS | Status: AC
  Administered 2018-10-03 – 2018-10-05 (×3): 500 mg via INTRAVENOUS
  Filled 2018-10-03 (×3): qty 5

## 2018-10-03 MED ORDER — TRAZODONE HCL 50 MG PO TABS
50.0000 mg | ORAL_TABLET | Freq: Every day | ORAL | Status: DC
  Administered 2018-10-03 – 2018-10-05 (×3): 50 mg
  Filled 2018-10-03 (×3): qty 1

## 2018-10-03 MED ORDER — CHLORHEXIDINE GLUCONATE 0.12 % MT SOLN
15.0000 mL | Freq: Two times a day (BID) | OROMUCOSAL | Status: DC
  Administered 2018-10-03 – 2018-10-06 (×6): 15 mL via OROMUCOSAL
  Filled 2018-10-03 (×6): qty 15

## 2018-10-03 MED ORDER — IPRATROPIUM-ALBUTEROL 0.5-2.5 (3) MG/3ML IN SOLN
3.0000 mL | Freq: Two times a day (BID) | RESPIRATORY_TRACT | Status: DC
  Administered 2018-10-03 – 2018-10-05 (×5): 3 mL via RESPIRATORY_TRACT
  Filled 2018-10-03 (×5): qty 3

## 2018-10-03 MED ORDER — FUROSEMIDE 20 MG PO TABS
20.0000 mg | ORAL_TABLET | Freq: Every day | ORAL | Status: DC
  Administered 2018-10-04 – 2018-10-06 (×3): 20 mg
  Filled 2018-10-03 (×3): qty 1

## 2018-10-03 NOTE — Progress Notes (Signed)
Talked to patients daughter Kim Wagner) this morning to get a baseline of patient. Daughter states "This is not normal and the change in mental status was very sudden". Kim Wagner has lived with her daughter since Kim Wagner /May of 2019 due to back pain. She was diagnosed with Spinal stenosis and osteoarthritis 12/2017. She had noticed her mom becoming more forgetful 6 months ago (telling the same stories over again) but negative of hallucinations and orientation of her surroundings. Daughter also mentioned that 8-9 years ago patient had her esophagus stretched because she was having trouble swallowing and getting choked on her food. Her daughter works 12 hr shifts and will be here this evening around 8pm to check on her mother.

## 2018-10-03 NOTE — Progress Notes (Signed)
PROGRESS NOTE   Kim Wagner  FAO:[id number]    DOB: Feb 12, 1956    DOA: 09/29/2018  PCP: Hadley Penobbins, Robert A, MD   I have briefly reviewed patients previous medical [facility].  Brief Narrative:  63 year old female with PMH of Chronic diastolic heart failure, nephrolithiasis, HTN, was brought to the hospital on 09/29/2018 with severe hyponatremia (sodium 116) and confusion.  She was admitted by CCM, treated with 3% saline and diuresed with Lasix with improvement in sodium.  [facility]  Still confused.  Dysphagia, failed swallow eval.  Cor track placed 1/10 and tube feeding initiated.  Sodium has improved and stabilized at 128.  Assessment & Plan:   Principal Problem:   Hyponatremia Active Problems:   Acute encephalopathy   Malnutrition of moderate degree   Acute on chronic diastolic (congestive) heart failure (HCC)   Hyponatremia/SIADH -As per chart review, patient has been admitted previously for symptomatic hyponatremia. -Presented with sodium of 116.  Baseline sodium in the high 120s-low 130s over the past year. -This is acute on chronic hyponatremia. -She was clinically volume overloaded on admission with 2+ pitting lower extremity edema, very mild JVD but chest x-ray without edema. -No history of excessive water intake.  No history of SSRI use.  TSH normal. -Reportedly had multiple CTs including head, chest and abdomen that did not suggest underlying malignancy. -Treated briefly with 3% normal saline and IV Lasix. -Sodium improved.  Hypertonic saline discontinued. -Urine sodium 43, urine osmolarity 387, serum osmolarity 252 suggestive of SIADH. -Periphery does not have any excess volume. -I discussed in detail with nephrologist on 1/10 and as per recommendations, continue Lasix 20 mg daily, added sodium chloride tablets 2 g 3 times daily, continue fluid restriction. -Sodium has improved and stabilized at 128 over the last 12 hours, suspect close to  baseline.  Continue to follow daily BMP.  Acute on chronic diastolic CHF. -Now without peripheral excess volume.  Continue Lasix 20 mg daily.  Hypertensive urgency -Discontinued low-dose ACEI initiated yesterday until sodium has stabilized.  Continue PRN IV hydralazine.  May consider starting amlodipine if blood pressures not controlled.  Acute metabolic encephalopathy -Multifactorial but most likely due to hyponatremia complicating underlying mild dementia, sleep deprivation, hospital delirium.  History of B12 deficiency, check levels and supplement.  Check thiamine level and treat with IV thiamine 500 mg daily x3 days.  Delirium precautions.  Trazodone at bedtime for sleep.  Monitor closely.  COPD/tobacco abuse:  - Stable without clinical bronchospasm. -Needs to quit smoking.   DVT prophylaxis: Subcutaneous heparin Code Status: Full Family Communication: I discussed in detail with patient's daughter, updated care and answered questions. Disposition: To be determined pending further clinical improvement.   Consultants:  CCM-signed off  Procedures:  Cor track placed 1/10.  Antimicrobials:  None   Subjective: Pleasantly confused but not agitated.  Oriented only to self.  As per RN, did not sleep at all last night, hallucinating, thinks that she is at work.  Patient tells me that she retired in April.  As per daughter, patient has been living with her since May 2018 due to back pain, not on pain medications and no recent medication changes, history of mild memory impairment but no mood fluctuations, wandering off or paranoia, takes gabapentin only occasionally.  ROS: As above, or unable due to mental status changes.  Objective:  Vitals:   10/03/18 0733 10/03/18 0744 10/03/18 0800 10/03/18 1127  BP:  (!) 158/96  (!) 162/92  Pulse:  94 99 92  Resp:  20 14 12   Temp:  98.3 F (36.8 C)  98 F (36.7 C)  TempSrc:  Oral    SpO2: 90% 90% 100% 98%  Weight:      Height:         Examination:  General exam: Pleasant middle-aged female, moderately built, thinly nourished, lying comfortably propped up in bed.  Does not appear in any distress. Respiratory system: Clear to auscultation. Respiratory effort normal.  Stable. Cardiovascular system: S1 & S2 heard, RRR. No JVD, murmurs, rubs, gallops or clicks. No pedal edema.  Telemetry personally reviewed: Sinus rhythm. Gastrointestinal system: Abdomen is nondistended, soft and nontender. No organomegaly or masses felt. Normal bowel sounds heard. Central nervous system: Alert and oriented x1.  Confused. No focal neurological deficits. Extremities: Symmetric 5 x 5 power. Skin: No rashes, lesions or ulcers Psychiatry: Judgement and insight impaired. Mood & affect appropriate.     Data Reviewed: I have personally reviewed following labs and imaging studies  CBC: Recent Labs  Lab 09/29/18 1429 09/29/18 1445 09/30/18 0545 10/01/18 0645  WBC 11.5*  --  11.1* 9.9  NEUTROABS 9.9*  --   --   --   HGB 13.9 15.6* 12.9 12.8  HCT 39.5 46.0 37.7 37.4  MCV 84.6  --  84.0 85.8  PLT 357  --  296 275   Basic Metabolic Panel: Recent Labs  Lab 09/30/18 0545  10/01/18 0645 10/01/18 1400 10/01/18 2257 10/02/18 0719 10/02/18 1618 10/03/18 0456  NA 123*   < > 124* 128* 126* 125* 128* 128*  K 3.5  --  3.5  --   --  3.8 3.5 3.6  CL 84*  --  86*  --   --  88* 89* 90*  CO2 28  --  28  --   --  28 30 31   GLUCOSE 79  --  92  --   --  103* 112* 124*  BUN 10  --  14  --   --  12 9 13   CREATININE 0.48  --  0.55  --   --  0.54 0.53 0.60  CALCIUM 9.1  --  8.9  --   --  9.2 9.2 9.6  MG 1.7  --   --   --   --   --   --   --   PHOS 3.8  --   --   --   --   --   --   --    < > = values in this interval not displayed.   Liver Function Tests: Recent Labs  Lab 09/29/18 1429 10/03/18 0456  AST 42* 22  ALT 18 16  ALKPHOS 82 65  BILITOT 1.0 0.4  PROT 6.7 6.4*  ALBUMIN 3.7 3.2*   Coagulation Profile: Recent Labs  Lab  09/29/18 1429  INR 1.05   CBG: Recent Labs  Lab 09/30/18 1516 10/02/18 2311 10/03/18 0319 10/03/18 0741 10/03/18 1126  GLUCAP 145* 137* 129* 129* 128*    Recent Results (from the past 240 hour(s))  MRSA PCR Screening     Status: None   Collection Time: 09/29/18  8:23 PM  Result Value Ref Range Status   MRSA by PCR NEGATIVE NEGATIVE Final    Comment:        The GeneXpert MRSA Assay (FDA approved for NASAL specimens only), is one component of a comprehensive MRSA colonization surveillance program. It is not intended to diagnose MRSA infection  nor to guide or monitor treatment for MRSA infections. Performed at [facility], [address] [place], [place] [address]          Radiology Studies: Dg Swallowing Func-speech Pathology  Result Date: 10/02/2018 Objective Swallowing Evaluation: Type of Study: MBS-Modified Barium Swallow Study  Patient Details Name: Kim Wagner MRN: [id number] Date of Birth: 1956/06/19 Today's Date: 10/02/2018 Time: SLP Start Time (ACUTE ONLY): 1400 -SLP Stop Time (ACUTE ONLY): 1500 SLP Time Calculation (min) (ACUTE ONLY): 60 min Past Medical History: Past Medical History: Diagnosis Date . Acute on chronic diastolic (congestive) heart failure (HCC)  . Chest discomfort   ONLY "IF RUNNING TO PLAY WITH GRANDCHILDREN" - PT IS A SMOKER AND HAS STRONG FAMILY HX OF HEART PROBLEMS AND WILL HAVE NUCLEAR STRESS TEST TUESDAY 06/01/13 FOR CLEARANCE FOR PLANNED URETEROSCOPY, LASER LITHO SURGERY ON 9/15. Marland Kitchen. History of kidney stones  Past Surgical History: Past Surgical History: Procedure Laterality Date . ABDOMINAL HYSTERECTOMY   . CYSTOSCOPY W/ RETROGRADES Left 04/18/2013  Procedure: CYSTOSCOPY WITH LEFT RETROGRADE PYELOGRAM; URETER STENT PLACEMENT;  Surgeon: Milford Cageaniel Young Woodruff, MD;  [facility];  Service: Urology;  Laterality: Left; . CYSTOSCOPY WITH RETROGRADE PYELOGRAM, URETEROSCOPY AND STENT PLACEMENT Left 06/07/2013  Procedure: CYSTOSCOPY, BILATERAL  RETROGRADE PYELOGRAM, LEFT URETEROSCOPY, LEFT LASER LITHOTRIPSY, LEFT URETER STENT EXCHANGE;  Surgeon: Milford Cageaniel Young Woodruff, MD;  Location: [facility] ORS;  Service: Urology;  Laterality: Left;  CYSTOSCOPY, BILATERAL RETROGRADE PYELOGRAM, LEFT URETEROSCOPY, LEFT LASER LITHOTRIPSY, LEFT URETER STENT EXCHANGE  . HOLMIUM LASER APPLICATION Left 06/07/2013  Procedure: HOLMIUM LASER APPLICATION;  Surgeon: Milford Cageaniel Young Woodruff, MD;  Location: [facility] ORS;  Service: Urology;  Laterality: Left; . right ankle surgery   . SHOULDER SURGERY Left  HPI: Pt is a 63 year old female history of diastolic HF, nephrolithiasis, hypertension who was brought in to ED secondary to confusion and minimal responsiveness.  She was found to be severely hyponatremic with volume overload causing acute encephalopathy. Chest x-ray and CT of the head of 09/29/18 were negative. SLP was consulted due to pt demonstrating coughing with thin liquids.  No data recorded Assessment / Plan / Recommendation CHL IP CLINICAL IMPRESSIONS 10/02/2018 Clinical Impression Pt presents with a likely transient dysphagia related to encephalopathy - however, degree of dysphagia was surprisingly severe given lack of other comorbidities.  Pt presented with adequate oral phase, but swallow response time was consistently delayed, allowing all POs - purees, nectar-thick liquids, and thin liquids - to enter the laryngeal vestibule and transition to vocal folds prior to execution of laryngeal vestibule closure.  When aspiration occurred, pt generally did not sense its presence, and a spontaneous cough was not elicited.  Pt required cues to cough, which did not eject aspirate from trachea.  Pt unable to follow commands for various compensatory strategies.  Given high risk of aspiration of all consistencies, recommend NPO with cortrak for now.  Allow ice chips after oral care. SLP will follow for readiness for repeat evaluation. Anticipate concomitant improvement in swallowing as mentation  clears. D/W Dr. Waymon AmatoHongalgi.  SLP Visit Diagnosis Dysphagia, pharyngeal phase (R13.13) Attention and concentration deficit following -- Frontal lobe and executive function deficit following -- Impact on safety and function Severe aspiration risk   CHL IP TREATMENT RECOMMENDATION 10/02/2018 Treatment Recommendations Therapy as outlined in treatment plan below   Prognosis 10/02/2018 Prognosis for Safe Diet Advancement Good Barriers to Reach Goals Cognitive deficits Barriers/Prognosis Comment -- CHL IP DIET RECOMMENDATION 10/02/2018 SLP Diet Recommendations NPO;Alternative means - temporary;Ice chips PRN after  oral care Liquid Administration via -- Medication Administration Via alternative means Compensations -- Postural Changes --   No flowsheet data found.  CHL IP FOLLOW UP RECOMMENDATIONS 10/02/2018 Follow up Recommendations Other (comment)   CHL IP FREQUENCY AND DURATION 10/02/2018 Speech Therapy Frequency (ACUTE ONLY) min 2x/week Treatment Duration 2 weeks      CHL IP ORAL PHASE 10/02/2018 Oral Phase WFL Oral - Pudding Teaspoon -- Oral - Pudding Cup -- Oral - Honey Teaspoon -- Oral - Honey Cup -- Oral - Nectar Teaspoon WFL Oral - Nectar Cup -- Oral - Nectar Straw WFL Oral - Thin Teaspoon WFL Oral - Thin Cup -- Oral - Thin Straw WFL Oral - Puree WFL Oral - Mech Soft -- Oral - Regular -- Oral - Multi-Consistency -- Oral - Pill -- Oral Phase - Comment --  CHL IP PHARYNGEAL PHASE 10/02/2018 Pharyngeal Phase Impaired Pharyngeal- Pudding Teaspoon -- Pharyngeal -- Pharyngeal- Pudding Cup -- Pharyngeal -- Pharyngeal- Honey Teaspoon -- Pharyngeal -- Pharyngeal- Honey Cup -- Pharyngeal -- Pharyngeal- Nectar Teaspoon Delayed swallow initiation-pyriform sinuses;Penetration/Aspiration before swallow;Pharyngeal residue - valleculae;Pharyngeal residue - pyriform Pharyngeal Material enters airway, CONTACTS cords and not ejected out Pharyngeal- Nectar Cup -- Pharyngeal -- Pharyngeal- Nectar Straw Delayed swallow initiation-pyriform  sinuses;Penetration/Aspiration before swallow;Pharyngeal residue - valleculae;Pharyngeal residue - pyriform Pharyngeal Material enters airway, CONTACTS cords and not ejected out Pharyngeal- Thin Teaspoon Delayed swallow initiation-pyriform sinuses;Penetration/Aspiration before swallow;Moderate aspiration;Pharyngeal residue - valleculae;Pharyngeal residue - pyriform Pharyngeal Material enters airway, passes BELOW cords and not ejected out despite cough attempt by patient Pharyngeal- Thin Cup -- Pharyngeal -- Pharyngeal- Thin Straw Delayed swallow initiation-pyriform sinuses;Penetration/Aspiration before swallow;Moderate aspiration;Pharyngeal residue - valleculae;Pharyngeal residue - pyriform Pharyngeal Material enters airway, passes BELOW cords and not ejected out despite cough attempt by patient Pharyngeal- Puree Delayed swallow initiation-vallecula;Penetration/Aspiration before swallow;Pharyngeal residue - valleculae;Pharyngeal residue - pyriform Pharyngeal Material enters airway, remains ABOVE vocal cords and not ejected out Pharyngeal- Mechanical Soft -- Pharyngeal -- Pharyngeal- Regular -- Pharyngeal -- Pharyngeal- Multi-consistency -- Pharyngeal -- Pharyngeal- Pill -- Pharyngeal -- Pharyngeal Comment --  CHL IP CERVICAL ESOPHAGEAL PHASE 10/02/2018 Cervical Esophageal Phase (No Data) Pudding Teaspoon -- Pudding Cup -- Honey Teaspoon -- Honey Cup -- Nectar Teaspoon -- Nectar Cup -- Nectar Straw -- Thin Teaspoon -- Thin Cup -- Thin Straw -- Puree -- Mechanical Soft -- Regular -- Multi-consistency -- Pill -- Cervical Esophageal Comment -- [name] 10/02/2018, 3:15 PM                   Scheduled Meds: . chlorhexidine  15 mL Mouth Rinse BID  . furosemide  20 mg Oral Daily  . heparin  5,000 Units Subcutaneous Q8H  . ipratropium-albuterol  3 mL Nebulization BID  . mouth rinse  15 mL Mouth Rinse BID  . nicotine  21 mg Transdermal Daily  . sodium chloride  2 g Oral TID WC   Continuous  Infusions: . feeding supplement (JEVITY 1.2 CAL) 50 mL/hr at 10/03/18 0730     LOS: 4 days     Marcellus Scott, MD, FACP, [facility]. [facility] Pager [phone]  If 7PM-7AM, please contact night-coverage [email or web address] Password [facility] 10/03/2018, 11:50 AM

## 2018-10-03 NOTE — Plan of Care (Signed)
Tube feeds started, working to goal.  Patient remains confused, sitter at bedside oriented to person, and time only.  Restraints applied for pt safety to keep from pulling out lines, will attempt to d/c as soon as we can.

## 2018-10-04 LAB — BASIC METABOLIC PANEL
Anion gap: 8 (ref 5–15)
BUN: 14 mg/dL (ref 8–23)
CO2: 30 mmol/L (ref 22–32)
CREATININE: 0.5 mg/dL (ref 0.44–1.00)
Calcium: 9.3 mg/dL (ref 8.9–10.3)
Chloride: 92 mmol/L — ABNORMAL LOW (ref 98–111)
GFR calc Af Amer: >60 mL/min (ref >60)
GFR calc non Af Amer: >60 mL/min (ref >60)
Glucose, Bld: 129 mg/dL — ABNORMAL HIGH (ref 70–99)
Potassium: 3.6 mmol/L (ref 3.5–5.1)
Sodium: 130 mmol/L — ABNORMAL LOW (ref 135–145)

## 2018-10-04 LAB — GLUCOSE, CAPILLARY
GLUCOSE-CAPILLARY: 128 mg/dL — AB (ref 70–99)
Glucose-Capillary: 114 mg/dL — ABNORMAL HIGH (ref 70–99)
Glucose-Capillary: 117 mg/dL — ABNORMAL HIGH (ref 70–99)
Glucose-Capillary: 121 mg/dL — ABNORMAL HIGH (ref 70–99)
Glucose-Capillary: 140 mg/dL — ABNORMAL HIGH (ref 70–99)
Glucose-Capillary: 147 mg/dL — ABNORMAL HIGH (ref 70–99)

## 2018-10-04 MED ORDER — SODIUM CHLORIDE 1 G PO TABS
2.0000 g | ORAL_TABLET | Freq: Two times a day (BID) | ORAL | Status: DC
  Administered 2018-10-04 (×2): 2 g
  Filled 2018-10-04 (×3): qty 2

## 2018-10-04 NOTE — Evaluation (Signed)
Physical Therapy Evaluation Patient Details Name: Kim Wagner MRN: [id number] DOB: 1956-07-20 Today's Date: 10/04/2018   History of Present Illness  Pt is a 63 year old female history of diastolic HF, nephrolithiasis, hypertension who was brought in to ED secondary to confusion and minimal responsiveness.  She was found to be severely hyponatremic with volume overload causing acute encephalopathy. Chest x-ray and CT of the head of 09/29/18 were negative.  Clinical Impression  Orders received for PT evaluation. Patient demonstrates deficits in functional mobility as indicated below. Will benefit from continued skilled PT to address deficits and maximize function. Will see as indicated and progress as tolerated.  At this time, patient requires min assist for mobility but anticipate will progress well as cognition improves. If family is able to assist then recommend HHPT upon discharge, if family unable to assist then will need ST SNF.    Follow Up Recommendations Supervision for mobility/OOB;SNF(If family able to provide 24/7 then HHPT)    Equipment Recommendations  None recommended by PT    Recommendations for Other Services       Precautions / Restrictions Precautions Precautions: Fall Restrictions Weight Bearing Restrictions: No      Mobility  Bed Mobility Overal bed mobility: Needs Assistance Bed Mobility: Supine to Sit     Supine to sit: Supervision     General bed mobility comments: supervision for safety and line management  Transfers Overall transfer level: Needs assistance Equipment used: 1 person hand held assist Transfers: Sit to/from Stand           General transfer comment: min assist for stability when coming to standing x2  Ambulation/Gait Ambulation/Gait assistance: Min assist Gait Distance (Feet): 110 Feet Assistive device: 1 person hand held assist Gait Pattern/deviations: Step-through pattern;Staggering left;Staggering right;Trunk  flexed;Ataxic Gait velocity: decreased Gait velocity interpretation: <1.8 ft/sec, indicate of risk for recurrent falls General Gait Details: patient with some instability and multiple LOB during short distance ambulatino, required HHA and additional support by therapist throughout  Stairs            Wheelchair Mobility    Modified Rankin (Stroke Patients Only)       Balance Overall balance assessment: Needs assistance   Sitting balance-Leahy Scale: Good       Standing balance-Leahy Scale: Fair Standing balance comment: with difficulty during dynamic movements                             Pertinent Vitals/Pain Pain Assessment: Faces Faces Pain Scale: No hurt    Home Living Family/patient expects to be discharged to:: Private residence Living Arrangements: Alone Available Help at Discharge: Family;Available 24 hours/day Type of Home: Apartment Home Access: Level entry(threshold)     Home Layout: One level Home Equipment: Walker - 2 wheels Additional Comments: Plans to discharge to daughter's home with multiple family members available to assist    Prior Function Level of Independence: Independent with assistive device(s)         Comments: Using RW recently due to increased falls and hip pain     Hand Dominance   Dominant Hand: Right    Extremity/Trunk Assessment   Upper Extremity Assessment Upper Extremity Assessment: Generalized weakness    Lower Extremity Assessment Lower Extremity Assessment: Generalized weakness    Cervical / Trunk Assessment Cervical / Trunk Assessment: Kyphotic  Communication   Communication: No difficulties  Cognition Arousal/Alertness: Awake/alert Behavior During Therapy: WFL for tasks assessed/performed Overall Cognitive Status: No family/caregiver  present to determine baseline cognitive functioning Area of Impairment: Problem solving;Safety/judgement                         Safety/Judgement:  Decreased awareness of safety;Decreased awareness of deficits   Problem Solving: Requires verbal cues;Requires tactile cues        General Comments      Exercises     Assessment/Plan    PT Assessment Patient needs continued PT services  PT Problem List Decreased strength;Decreased activity tolerance;Decreased mobility;Decreased balance;Decreased cognition;Decreased safety awareness       PT Treatment Interventions DME instruction;Gait training;Stair training;Functional mobility training;Therapeutic activities;Therapeutic exercise;Balance training;Patient/family education    PT Goals (Current goals can be found in the Care Plan section)  Acute Rehab PT Goals Patient Stated Goal: to go home PT Goal Formulation: With patient Time For Goal Achievement: 10/18/18 Potential to Achieve Goals: Good    Frequency Min 3X/week   Barriers to discharge        Co-evaluation               AM-PAC PT "6 Clicks" Mobility  Outcome Measure Help needed turning from your back to your side while in a flat bed without using bedrails?: None Help needed moving from lying on your back to sitting on the side of a flat bed without using bedrails?: A Little Help needed moving to and from a bed to a chair (including a wheelchair)?: A Little Help needed standing up from a chair using your arms (e.g., wheelchair or bedside chair)?: A Little Help needed to walk in hospital room?: A Little Help needed climbing 3-5 steps with a railing? : A Little 6 Click Score: 19    End of Session Equipment Utilized During Treatment: Gait belt Activity Tolerance: Patient tolerated treatment well Patient left: in chair;with call bell/phone within reach;with chair alarm set;with restraints reapplied Nurse Communication: Mobility status PT Visit Diagnosis: Unsteadiness on feet (R26.81);Difficulty in walking, not elsewhere classified (R26.2);Other symptoms and signs involving the nervous system (R29.898)    Time:  5956-3875 PT Time Calculation (min) (ACUTE ONLY): 18 min   Charges:   PT Evaluation $PT Eval Moderate Complexity: 1 Mod          Charlotte Crumb, PT DPT  Board Certified Neurologic Specialist Acute Rehabilitation Services Pager [phone] Office [phone]   Fabio Asa 10/04/2018, 11:05 AM

## 2018-10-04 NOTE — Plan of Care (Signed)

## 2018-10-04 NOTE — Progress Notes (Signed)
PROGRESS NOTE   Kim Wagner  AVW:[id number]    DOB: [birth year]    DOA: 09/29/2018  PCP: Hadley Penobbins, Robert A, MD   I have briefly reviewed patients [facility].  Brief Narrative:  63 year old female with PMH of Chronic diastolic heart failure, nephrolithiasis, HTN, was brought to the hospital on 09/29/2018 with severe hyponatremia (sodium 116) and confusion.  She was admitted by CCM, treated with 3% saline and diuresed with Lasix with improvement in sodium.  [facility]  Still confused.  Dysphagia, failed swallow eval.  Cor track placed 1/10 and tube feeding initiated.  Sodium has improved and back to baseline/130.  Mental status improving.  Assessment & Plan:   Principal Problem:   Hyponatremia Active Problems:   Acute encephalopathy   Malnutrition of moderate degree   Acute on chronic diastolic (congestive) heart failure (HCC)   Hyponatremia/SIADH -As per chart review, patient has been admitted previously for symptomatic hyponatremia. -Presented with sodium of 116.  Baseline sodium in the high 120s-low 130s over the past year. -This is acute on chronic hyponatremia. -She was clinically volume overloaded on admission with 2+ pitting lower extremity edema, very mild JVD but chest x-ray without edema. -No history of excessive water intake.  No history of SSRI use.  TSH normal. -Reportedly had multiple CTs including head, chest and abdomen that did not suggest underlying malignancy. -Treated briefly with 3% normal saline and IV Lasix. -Sodium improved.  Hypertonic saline discontinued. -Urine sodium 43, urine osmolarity 387, serum osmolarity 252 suggestive of SIADH. -Periphery does not have any excess volume. -I discussed in detail with nephrologist on 1/10 and as per recommendations, continue Lasix 20 mg daily, added sodium chloride tablets 2 g 3 times daily, continue fluid restriction. -Sodium is improved to 130.  As discussed with  nephrologist today, taper sodium chloride tablets over a few days with close outpatient follow-up with PCP with labs.  Acute on chronic diastolic CHF. -Now without peripheral excess volume.  Continue Lasix 20 mg daily.  Hypertensive urgency -Discontinued low-dose ACEI initiated yesterday until sodium has stabilized.  Continue PRN IV hydralazine.  May consider starting amlodipine if blood pressures not controlled.  Controlled.  Acute metabolic encephalopathy -Multifactorial but most likely due to hyponatremia complicating underlying mild dementia, sleep deprivation, hospital delirium.  History of B12 deficiency, check levels and supplement.  Check thiamine level and treat with IV thiamine 500 mg daily x3 days.  Delirium precautions.  Trazodone at bedtime for sleep.   -Mental status significantly better on 1/12.  COPD/tobacco abuse:  - Stable without clinical bronchospasm. -Needs to quit smoking.   DVT prophylaxis: Subcutaneous heparin Code Status: Full Family Communication: None at bedside today.   Disposition: To be determined pending further clinical improvement.   Consultants:  CCM-signed off  Procedures:  Cor track placed 1/10.  Antimicrobials:  None   Subjective: When I saw her patient early this morning, she was still sleeping but easily arousable and oriented to self.  As per extensive discussion with RN, slept well last night without agitation or hallucination.  Later this morning, ambulated with PT, conversant and much more coherent but forgetful at times, restraints removed and sitting on chair.  As per daughter, patient has been living with her since May 2018 due to back pain, not on pain medications and no recent medication changes, history of mild memory impairment but no mood fluctuations, wandering off or paranoia, takes gabapentin only occasionally.  ROS: As above,  or unable due to mental status changes.  Objective:  Vitals:   10/04/18 0500 10/04/18 0747  10/04/18 0800 10/04/18 0912  BP:  121/77 114/75   Pulse:  91 89   Resp:  12 11   Temp:  98 F (36.7 C)    TempSrc:  Oral    SpO2:  100% 100% 98%  Weight: 50.9 kg     Height:        Examination:  General exam: Pleasant middle-aged female, moderately built, thinly nourished, lying comfortably propped up in bed.  Appeared comfortable. Respiratory system: Clear to auscultation. Respiratory effort normal.  Stable. Cardiovascular system: S1 & S2 heard, RRR. No JVD, murmurs, rubs, gallops or clicks. No pedal edema.  Telemetry personally reviewed: Sinus rhythm. Gastrointestinal system: Abdomen is nondistended, soft and nontender. No organomegaly or masses felt. Normal bowel sounds heard.  Stable. Central nervous system: Alert and oriented x1.  No focal neurological deficits.  Mental status as noted above. Extremities: Symmetric 5 x 5 power. Skin: No rashes, lesions or ulcers Psychiatry: Judgement and insight impaired. Mood & affect appropriate.     Data Reviewed: I have personally reviewed following labs and imaging studies  CBC: Recent Labs  Lab 09/29/18 1429 09/29/18 1445 09/30/18 0545 10/01/18 0645  WBC 11.5*  --  11.1* 9.9  NEUTROABS 9.9*  --   --   --   HGB 13.9 15.6* 12.9 12.8  HCT 39.5 46.0 37.7 37.4  MCV 84.6  --  84.0 85.8  PLT 357  --  296 275   Basic Metabolic Panel: Recent Labs  Lab 09/30/18 0545  10/01/18 0645  10/01/18 2257 10/02/18 0719 10/02/18 1618 10/03/18 0456 10/04/18 0221  NA 123*   < > 124*   < > 126* 125* 128* 128* 130*  K 3.5  --  3.5  --   --  3.8 3.5 3.6 3.6  CL 84*  --  86*  --   --  88* 89* 90* 92*  CO2 28  --  28  --   --  28 30 31 30   GLUCOSE 79  --  92  --   --  103* 112* 124* 129*  BUN 10  --  14  --   --  12 9 13 14   CREATININE 0.48  --  0.55  --   --  0.54 0.53 0.60 0.50  CALCIUM 9.1  --  8.9  --   --  9.2 9.2 9.6 9.3  MG 1.7  --   --   --   --   --   --   --   --   PHOS 3.8  --   --   --   --   --   --   --   --    < > = values  in this interval not displayed.   Liver Function Tests: Recent Labs  Lab 09/29/18 1429 10/03/18 0456  AST 42* 22  ALT 18 16  ALKPHOS 82 65  BILITOT 1.0 0.4  PROT 6.7 6.4*  ALBUMIN 3.7 3.2*   Coagulation Profile: Recent Labs  Lab 09/29/18 1429  INR 1.05   CBG: Recent Labs  Lab 10/03/18 0741 10/03/18 1126 10/03/18 1642 10/04/18 0436 10/04/18 0745  GLUCAP 129* 128* 132* 114* 128*    Recent Results (from the past 240 hour(s))  MRSA PCR Screening     Status: None   Collection Time: 09/29/18  8:23 PM  Result Value Ref Range Status  MRSA by PCR NEGATIVE NEGATIVE Final    Comment:        The GeneXpert MRSA Assay (FDA approved for NASAL specimens only), is one component of a comprehensive MRSA colonization surveillance program. It is not intended to diagnose MRSA infection nor to guide or monitor treatment for MRSA infections. Performed at [facility], [address], [place], [place] [address]          Radiology Studies: Dg Swallowing Func-speech Pathology  Result Date: 10/02/2018 Objective Swallowing Evaluation: Type of Study: MBS-Modified Barium Swallow Study  Patient Details Name: Braeleigh Pyper MRN: [id number] Date of Birth: [birth year] Today's Date: 10/02/2018 Time: SLP Start Time (ACUTE ONLY): 1400 -SLP Stop Time (ACUTE ONLY): 1500 SLP Time Calculation (min) (ACUTE ONLY): 60 min Past Medical History: Past Medical History: Diagnosis Date . Acute on chronic diastolic (congestive) heart failure (HCC)  . Chest discomfort   ONLY "IF RUNNING TO PLAY WITH GRANDCHILDREN" - PT IS A SMOKER AND HAS STRONG FAMILY HX OF HEART PROBLEMS AND WILL HAVE NUCLEAR STRESS TEST TUESDAY 06/01/13 FOR CLEARANCE FOR PLANNED URETEROSCOPY, LASER LITHO SURGERY ON 9/15. Marland Kitchen History of kidney stones  Past Surgical History: Past Surgical History: Procedure Laterality Date . ABDOMINAL HYSTERECTOMY   . CYSTOSCOPY W/ RETROGRADES Left 04/18/2013  Procedure: CYSTOSCOPY WITH LEFT RETROGRADE PYELOGRAM;  URETER STENT PLACEMENT;  Surgeon: Milford Cage, MD;  Location: [facility] OR;  Service: Urology;  Laterality: Left; . CYSTOSCOPY WITH RETROGRADE PYELOGRAM, URETEROSCOPY AND STENT PLACEMENT Left 06/07/2013  Procedure: CYSTOSCOPY, BILATERAL RETROGRADE PYELOGRAM, LEFT URETEROSCOPY, LEFT LASER LITHOTRIPSY, LEFT URETER STENT EXCHANGE;  Surgeon: Milford Cage, MD;  Location: [facility] ORS;  Service: Urology;  Laterality: Left;  CYSTOSCOPY, BILATERAL RETROGRADE PYELOGRAM, LEFT URETEROSCOPY, LEFT LASER LITHOTRIPSY, LEFT URETER STENT EXCHANGE  . HOLMIUM LASER APPLICATION Left 06/07/2013  Procedure: HOLMIUM LASER APPLICATION;  Surgeon: Milford Cage, MD;  Location: [facility] ORS;  Service: Urology;  Laterality: Left; . right ankle surgery   . SHOULDER SURGERY Left  HPI: Pt is a 63 year old female history of diastolic HF, nephrolithiasis, hypertension who was brought in to ED secondary to confusion and minimal responsiveness.  She was found to be severely hyponatremic with volume overload causing acute encephalopathy. Chest x-ray and CT of the head of 09/29/18 were negative. SLP was consulted due to pt demonstrating coughing with thin liquids.  No data recorded Assessment / Plan / Recommendation CHL IP CLINICAL IMPRESSIONS 10/02/2018 Clinical Impression Pt presents with a likely transient dysphagia related to encephalopathy - however, degree of dysphagia was surprisingly severe given lack of other comorbidities.  Pt presented with adequate oral phase, but swallow response time was consistently delayed, allowing all POs - purees, nectar-thick liquids, and thin liquids - to enter the laryngeal vestibule and transition to vocal folds prior to execution of laryngeal vestibule closure.  When aspiration occurred, pt generally did not sense its presence, and a spontaneous cough was not elicited.  Pt required cues to cough, which did not eject aspirate from trachea.  Pt unable to follow commands for various compensatory strategies.   Given high risk of aspiration of all consistencies, recommend NPO with cortrak for now.  Allow ice chips after oral care. SLP will follow for readiness for repeat evaluation. Anticipate concomitant improvement in swallowing as mentation clears. D/W Dr. Waymon Amato.  SLP Visit Diagnosis Dysphagia, pharyngeal phase (R13.13) Attention and concentration deficit following -- Frontal lobe and executive function deficit following -- Impact on safety and function Severe aspiration risk   CHL IP TREATMENT  RECOMMENDATION 10/02/2018 Treatment Recommendations Therapy as outlined in treatment plan below   Prognosis 10/02/2018 Prognosis for Safe Diet Advancement Good Barriers to Reach Goals Cognitive deficits Barriers/Prognosis Comment -- CHL IP DIET RECOMMENDATION 10/02/2018 SLP Diet Recommendations NPO;Alternative means - temporary;Ice chips PRN after oral care Liquid Administration via -- Medication Administration Via alternative means Compensations -- Postural Changes --   No flowsheet data found.  CHL IP FOLLOW UP RECOMMENDATIONS 10/02/2018 Follow up Recommendations Other (comment)   CHL IP FREQUENCY AND DURATION 10/02/2018 Speech Therapy Frequency (ACUTE ONLY) min 2x/week Treatment Duration 2 weeks      CHL IP ORAL PHASE 10/02/2018 Oral Phase WFL Oral - Pudding Teaspoon -- Oral - Pudding Cup -- Oral - Honey Teaspoon -- Oral - Honey Cup -- Oral - Nectar Teaspoon WFL Oral - Nectar Cup -- Oral - Nectar Straw WFL Oral - Thin Teaspoon WFL Oral - Thin Cup -- Oral - Thin Straw WFL Oral - Puree WFL Oral - Mech Soft -- Oral - Regular -- Oral - Multi-Consistency -- Oral - Pill -- Oral Phase - Comment --  CHL IP PHARYNGEAL PHASE 10/02/2018 Pharyngeal Phase Impaired Pharyngeal- Pudding Teaspoon -- Pharyngeal -- Pharyngeal- Pudding Cup -- Pharyngeal -- Pharyngeal- Honey Teaspoon -- Pharyngeal -- Pharyngeal- Honey Cup -- Pharyngeal -- Pharyngeal- Nectar Teaspoon Delayed swallow initiation-pyriform sinuses;Penetration/Aspiration before  swallow;Pharyngeal residue - valleculae;Pharyngeal residue - pyriform Pharyngeal Material enters airway, CONTACTS cords and not ejected out Pharyngeal- Nectar Cup -- Pharyngeal -- Pharyngeal- Nectar Straw Delayed swallow initiation-pyriform sinuses;Penetration/Aspiration before swallow;Pharyngeal residue - valleculae;Pharyngeal residue - pyriform Pharyngeal Material enters airway, CONTACTS cords and not ejected out Pharyngeal- Thin Teaspoon Delayed swallow initiation-pyriform sinuses;Penetration/Aspiration before swallow;Moderate aspiration;Pharyngeal residue - valleculae;Pharyngeal residue - pyriform Pharyngeal Material enters airway, passes BELOW cords and not ejected out despite cough attempt by patient Pharyngeal- Thin Cup -- Pharyngeal -- Pharyngeal- Thin Straw Delayed swallow initiation-pyriform sinuses;Penetration/Aspiration before swallow;Moderate aspiration;Pharyngeal residue - valleculae;Pharyngeal residue - pyriform Pharyngeal Material enters airway, passes BELOW cords and not ejected out despite cough attempt by patient Pharyngeal- Puree Delayed swallow initiation-vallecula;Penetration/Aspiration before swallow;Pharyngeal residue - valleculae;Pharyngeal residue - pyriform Pharyngeal Material enters airway, remains ABOVE vocal cords and not ejected out Pharyngeal- Mechanical Soft -- Pharyngeal -- Pharyngeal- Regular -- Pharyngeal -- Pharyngeal- Multi-consistency -- Pharyngeal -- Pharyngeal- Pill -- Pharyngeal -- Pharyngeal Comment --  CHL IP CERVICAL ESOPHAGEAL PHASE 10/02/2018 Cervical Esophageal Phase (No Data) Pudding Teaspoon -- Pudding Cup -- Honey Teaspoon -- Honey Cup -- Nectar Teaspoon -- Nectar Cup -- Nectar Straw -- Thin Teaspoon -- Thin Cup -- Thin Straw -- Puree -- Mechanical Soft -- Regular -- Multi-consistency -- Pill -- Cervical Esophageal Comment -- [name] 10/02/2018, 3:15 PM                   Scheduled Meds: . chlorhexidine  15 mL Mouth Rinse BID  . furosemide  20  mg Per Tube Daily  . heparin  5,000 Units Subcutaneous Q8H  . ipratropium-albuterol  3 mL Nebulization BID  . mouth rinse  15 mL Mouth Rinse BID  . nicotine  21 mg Transdermal Daily  . sodium chloride  2 g Per Tube BID WC  . traZODone  50 mg Per Tube QHS  . vitamin B-12  1,000 mcg Per Tube Daily   Continuous Infusions: . feeding supplement (JEVITY 1.2 CAL) 50 mL/hr at 10/04/18 0800  . thiamine injection 500 mg (10/04/18 1025)     LOS: 5 days     Marcellus Scott, MD, FACP, [facility].  [facility] Pager [phone]  If 7PM-7AM, please contact night-coverage [email or web address] Password [email or web address] 10/04/2018, 10:59 AM

## 2018-10-05 ENCOUNTER — Inpatient Hospital Stay (HOSPITAL_COMMUNITY): Payer: BLUE CROSS/BLUE SHIELD

## 2018-10-05 LAB — BASIC METABOLIC PANEL
Anion gap: 6 (ref 5–15)
BUN: 20 mg/dL (ref 8–23)
CO2: 33 mmol/L — AB (ref 22–32)
Calcium: 9.5 mg/dL (ref 8.9–10.3)
Chloride: 94 mmol/L — ABNORMAL LOW (ref 98–111)
Creatinine, Ser: 0.56 mg/dL (ref 0.44–1.00)
GFR calc non Af Amer: >60 mL/min (ref >60)
Glucose, Bld: 116 mg/dL — ABNORMAL HIGH (ref 70–99)
Potassium: 4.4 mmol/L (ref 3.5–5.1)
Sodium: 133 mmol/L — ABNORMAL LOW (ref 135–145)

## 2018-10-05 LAB — GLUCOSE, CAPILLARY
GLUCOSE-CAPILLARY: 132 mg/dL — AB (ref 70–99)
Glucose-Capillary: 143 mg/dL — ABNORMAL HIGH (ref 70–99)
Glucose-Capillary: 155 mg/dL — ABNORMAL HIGH (ref 70–99)
Glucose-Capillary: 82 mg/dL (ref 70–99)
Glucose-Capillary: 135 mg/dL — ABNORMAL HIGH (ref 70–99)

## 2018-10-05 NOTE — Plan of Care (Signed)
  Problem: Education: Goal: Knowledge of General Education information will improve Description Including pain rating scale, medication(s)/side effects and non-pharmacologic comfort measures Outcome: Progressing   Problem: Clinical Measurements: Goal: Ability to maintain clinical measurements within normal limits will improve Outcome: Progressing Goal: Diagnostic test results will improve Outcome: Progressing   Problem: Activity: Goal: Risk for activity intolerance will decrease Outcome: Progressing   Problem: Nutrition: Goal: Adequate nutrition will be maintained Outcome: Progressing   Problem: Pain Managment: Goal: General experience of comfort will improve Outcome: Progressing   

## 2018-10-05 NOTE — Progress Notes (Signed)
PROGRESS NOTE   Kim Wagner  RUE:[id number]    DOB: April 25, 1956    DOA: 09/29/2018  PCP: Hadley Pen, MD   I have briefly reviewed patients previous medical records in [facility].  Brief Narrative:  63 year old female with PMH of Chronic diastolic heart failure, nephrolithiasis, HTN, was brought to the hospital on 09/29/2018 with severe hyponatremia (sodium 116) and confusion.  She was admitted by CCM, treated with 3% saline and diuresed with Lasix with improvement in sodium.  She was transferred to [facility] 1/9.  Still confused.  Dysphagia, failed swallow eval.  Cor track placed 1/10 and tube feeding initiated.  Sodium has improved and back to baseline/133.  Mental status significantly improved and probably approaching baseline.  Assessment & Plan:   Principal Problem:   Hyponatremia Active Problems:   Acute encephalopathy   Malnutrition of moderate degree   Acute on chronic diastolic (congestive) heart failure (HCC)   Hyponatremia/SIADH -As per chart review, patient has been admitted previously for symptomatic hyponatremia. -Presented with sodium of 116.  Baseline sodium in the high 120s-low 130s over the past year. -This is acute on chronic hyponatremia. -She was clinically volume overloaded on admission with 2+ pitting lower extremity edema, very mild JVD but chest x-ray without edema. -No history of excessive water intake.  No history of SSRI use.  TSH normal. -Reportedly had multiple CTs including head, chest and abdomen that did not suggest underlying malignancy. -Treated briefly with 3% normal saline and IV Lasix. -Sodium improved.  Hypertonic saline discontinued. -Urine sodium 43, urine osmolarity 387, serum osmolarity 252 suggestive of SIADH. -Periphery does not have any excess volume. -I discussed in detail with nephrologist on 1/10 and as per recommendations, continue Lasix 20 mg daily, added sodium chloride tablets 2 g 3 times daily, continue fluid restriction. -Sodium  is improved to 133.  Discontinue salt tablets and monitor BMP.  Acute on chronic diastolic CHF. -Now without peripheral excess volume.  Continue Lasix 20 mg daily.  Hypertensive urgency -Discontinued low-dose ACEI initiated yesterday until sodium has stabilized.  Continue PRN IV hydralazine.  May consider starting amlodipine if blood pressures not controlled.  Controlled.  Acute metabolic encephalopathy -Multifactorial but most likely due to hyponatremia complicating underlying mild dementia, sleep deprivation, hospital delirium.  History of B12 deficiency, check levels and supplement.  Check thiamine level and treat with IV thiamine 500 mg daily x3 days.  Delirium precautions.  Trazodone at bedtime for sleep.   -Mental status continues to gradually improve over the last 48 hours.  Apart from mild confusion overnight, mental status changes probably resolved at this time.  COPD/tobacco abuse:  - Stable without clinical bronchospasm. -Needs to quit smoking.  Dysphagia -Patient and daughter report swallowing difficulties PTA.  She failed swallow evaluation on Friday and was started on tube feeds via core track. -SLP to reassess swallowing today.  Hopefully she can start oral diet and NG tube can be removed.   DVT prophylaxis: Subcutaneous heparin Code Status: Full Family Communication: None at bedside today.   Disposition: To be determined pending further clinical improvement, hopefully in the next 24 hours pending oral diet.   Consultants:  CCM-signed off  Procedures:  Cor track placed 1/10.  Antimicrobials:  None   Subjective: As per night RN, mild confusion overnight for a couple of hours which then resolved.  Interviewed and examined with RN this morning.  Patient sitting up in chair.  Appears comfortable and much improved compared to 48 hours ago.  Alert  and oriented x4 and fully coherent.  Answers all questions appropriately.  Denies dyspnea, pain or any other complaints.   Did indicate that she had some swallowing difficulties PTA with bread but not to meet.  As per daughter, patient has been living with her since May 2018 due to back pain, not on pain medications and no recent medication changes, history of mild memory impairment but no mood fluctuations, wandering off or paranoia, takes gabapentin only occasionally.  ROS: As above, or unable due to mental status changes.  Objective:  Vitals:   10/04/18 2039 10/05/18 0250 10/05/18 0749 10/05/18 0934  BP: 139/88     Pulse: 88   85  Resp: 18     Temp: 98.1 F (36.7 C)   98.5 F (36.9 C)  TempSrc: Oral   Oral  SpO2: 94%  98% 92%  Weight:  50.9 kg    Height:        Examination:  General exam: Pleasant middle-aged female, moderately built, thinly nourished, sitting up comfortably in chair.  No restraints or sitter at this time. Respiratory system: Clear to auscultation. Respiratory effort normal.  Stable without change Cardiovascular system: S1 & S2 heard, RRR. No JVD, murmurs, rubs, gallops or clicks. No pedal edema.  Stable without change Gastrointestinal system: Abdomen is nondistended, soft and nontender. No organomegaly or masses felt. Normal bowel sounds heard.  Stable Central nervous system: Alert and oriented x4.  No focal neurological deficits.  Extremities: Symmetric 5 x 5 power. Skin: No rashes, lesions or ulcers Psychiatry: Judgement and insight intact. Mood & affect appropriate.     Data Reviewed: I have personally reviewed following labs and imaging studies  CBC: Recent Labs  Lab 09/29/18 1429 09/29/18 1445 09/30/18 0545 10/01/18 0645  WBC 11.5*  --  11.1* 9.9  NEUTROABS 9.9*  --   --   --   HGB 13.9 15.6* 12.9 12.8  HCT 39.5 46.0 37.7 37.4  MCV 84.6  --  84.0 85.8  PLT 357  --  296 275   Basic Metabolic Panel: Recent Labs  Lab 09/30/18 0545  10/02/18 0719 10/02/18 1618 10/03/18 0456 10/04/18 0221 10/05/18 0237  NA 123*   < > 125* 128* 128* 130* 133*  K 3.5   < >  3.8 3.5 3.6 3.6 4.4  CL 84*   < > 88* 89* 90* 92* 94*  CO2 28   < > 28 30 31 30  33*  GLUCOSE 79   < > 103* 112* 124* 129* 116*  BUN 10   < > 12 9 13 14 20   CREATININE 0.48   < > 0.54 0.53 0.60 0.50 0.56  CALCIUM 9.1   < > 9.2 9.2 9.6 9.3 9.5  MG 1.7  --   --   --   --   --   --   PHOS 3.8  --   --   --   --   --   --    < > = values in this interval not displayed.   Liver Function Tests: Recent Labs  Lab 09/29/18 1429 10/03/18 0456  AST 42* 22  ALT 18 16  ALKPHOS 82 65  BILITOT 1.0 0.4  PROT 6.7 6.4*  ALBUMIN 3.7 3.2*   Coagulation Profile: Recent Labs  Lab 09/29/18 1429  INR 1.05   CBG: Recent Labs  Lab 10/04/18 1650 10/04/18 2021 10/04/18 2343 10/05/18 0525 10/05/18 0725  GLUCAP 117* 147* 121* 143* 132*    Recent Results (  from the past 240 hour(s))  MRSA PCR Screening     Status: None   Collection Time: 09/29/18  8:23 PM  Result Value Ref Range Status   MRSA by PCR NEGATIVE NEGATIVE Final    Comment:        The GeneXpert MRSA Assay (FDA approved for NASAL specimens only), is one component of a comprehensive MRSA colonization surveillance program. It is not intended to diagnose MRSA infection nor to guide or monitor treatment for MRSA infections. Performed at [facility], [address] [place], [place] [address]          Radiology Studies: No results found.      Scheduled Meds: . chlorhexidine  15 mL Mouth Rinse BID  . furosemide  20 mg Per Tube Daily  . heparin  5,000 Units Subcutaneous Q8H  . ipratropium-albuterol  3 mL Nebulization BID  . mouth rinse  15 mL Mouth Rinse BID  . nicotine  21 mg Transdermal Daily  . traZODone  50 mg Per Tube QHS  . vitamin B-12  1,000 mcg Per Tube Daily   Continuous Infusions: . feeding supplement (JEVITY 1.2 CAL) 1,000 mL (10/05/18 0503)  . thiamine injection 500 mg (10/05/18 0931)     LOS: 6 days     Marcellus ScottAnand Neamiah Sciarra, MD, FACP, [facility]. [facility] Pager [phone]  If 7PM-7AM,  please contact night-coverage [email or web address] Password [email or web address] 10/05/2018, 9:49 AM

## 2018-10-05 NOTE — Care Management Note (Addendum)
Case Management Note  Patient Details  Name: Kim Wagner MRN: [id number] Date of Birth: 18-May-1956  Subjective/Objective:    Pt admitted with abnormal electrolytes                Action/Plan:   PTA independent from home with daughter - daughter confirms that she will provide 24/7 supervision at discharge. CM left medicare.gov HH agency list for pt to review with daughter - CM will follow up tomorrow for chose   Expected Discharge Date:                  Expected Discharge Plan:  Home w Home Health Services  In-House Referral:     Discharge planning Services  CM Consult  Post Acute Care Choice:    Choice offered to:     DME Arranged:    DME Agency:     HH Arranged:    HH Agency:     Status of Service:  In process, will continue to follow  If discussed at Long Length of Stay Meetings, dates discussed:    Additional Comments:  Cherylann Parr, RN 10/05/2018, 2:58 PM

## 2018-10-05 NOTE — Progress Notes (Addendum)
Physical Therapy Treatment Patient Details Name: Kim Wagner MRN: [id number] DOB: [birth year] Today's Date: 10/05/2018    History of Present Illness Pt is a 63 year old female history of diastolic HF, nephrolithiasis, hypertension who was brought in to ED secondary to confusion and minimal responsiveness.  She was found to be severely hyponatremic with volume overload causing acute encephalopathy. Chest x-ray and CT of the head of 09/29/18 were negative.    PT Comments    Pt making steady progress. Still with some confusion and will need 24 hour assist at home. Daughter able to provide.    Follow Up Recommendations  Home health PT;Supervision/Assistance - 24 hour(family able to provide 24 hour supervison)     Equipment Recommendations  None recommended by PT    Recommendations for Other Services       Precautions / Restrictions Precautions Precautions: Fall    Mobility  Bed Mobility               General bed mobility comments: Pt up in chair  Transfers Overall transfer level: Needs assistance Equipment used: 1 person hand held assist;4-wheeled walker Transfers: Sit to/from Stand Sit to Stand: Min assist         General transfer comment: Assist for balance to come to stand  Ambulation/Gait Ambulation/Gait assistance: Min guard Gait Distance (Feet): 225 Feet Assistive device: 4-wheeled walker Gait Pattern/deviations: Step-through pattern;Decreased stride length;Trunk flexed Gait velocity: decreased Gait velocity interpretation: <1.8 ft/sec, indicate of risk for recurrent falls General Gait Details: Assist for safety and stability. Pt with improved stability using rollator. Pt incontinent of urine due to urgency when amb initiated.    Stairs             Wheelchair Mobility    Modified Rankin (Stroke Patients Only)       Balance Overall balance assessment: Needs assistance Sitting-balance support: No upper extremity supported Sitting balance-Leahy  Scale: Good     Standing balance support: No upper extremity supported;During functional activity Standing balance-Leahy Scale: Fair                              Cognition Arousal/Alertness: Awake/alert Behavior During Therapy: WFL for tasks assessed/performed Overall Cognitive Status: No family/caregiver present to determine baseline cognitive functioning Area of Impairment: Problem solving;Safety/judgement                         Safety/Judgement: Decreased awareness of safety;Decreased awareness of deficits   Problem Solving: Requires verbal cues;Requires tactile cues        Exercises      General Comments        Pertinent Vitals/Pain Pain Assessment: No/denies pain Faces Pain Scale: No hurt    Home Living                      Prior Function            PT Goals (current goals can now be found in the care plan section) Progress towards PT goals: Progressing toward goals    Frequency    Min 3X/week      PT Plan Discharge plan needs to be updated    Co-evaluation              AM-PAC PT "6 Clicks" Mobility   Outcome Measure  Help needed turning from your back to your side while in a flat bed without using bedrails?: None Help  needed moving from lying on your back to sitting on the side of a flat bed without using bedrails?: A Little Help needed moving to and from a bed to a chair (including a wheelchair)?: A Little Help needed standing up from a chair using your arms (e.g., wheelchair or bedside chair)?: A Little Help needed to walk in hospital room?: A Little Help needed climbing 3-5 steps with a railing? : A Little 6 Click Score: 19    End of Session Equipment Utilized During Treatment: Gait belt Activity Tolerance: Patient tolerated treatment well Patient left: in chair;with call bell/phone within reach;with chair alarm set Nurse Communication: Mobility status PT Visit Diagnosis: Unsteadiness on feet  (R26.81);Difficulty in walking, not elsewhere classified (R26.2);Other symptoms and signs involving the nervous system (D14.970)     Time: 2637-8588 PT Time Calculation (min) (ACUTE ONLY): 27 min  Charges:  $Gait Training: 23-37 mins                     [facility] PT Acute Rehabilitation Services Pager [phone] Office [phone]    Angelina Ok [facility] 10/05/2018, 4:20 PM

## 2018-10-05 NOTE — Progress Notes (Signed)
CSW spoke with patients daughter, they would like to speak with the patient about what she would like to do.   Daughter is declining SNF. She stated that if her mother needed any additional care once she was medically stable, then she would like to pursue home health as an option.   CSW signing off for now.   Drucilla Schmidt, MSW, LCSW-A Clinical Social Worker [employer]

## 2018-10-05 NOTE — Progress Notes (Signed)
Nutrition Follow-up  DOCUMENTATION CODES:   Not applicable  INTERVENTION:   -If pt does not pass MBSS, resume Jevity 1.2 @ 50 ml/hr via cortrak tube -Will monitor for diet advancement and add supplements as appropriate  NUTRITION DIAGNOSIS:   Increased nutrient needs related to chronic illness(CHF, smoker, thyroid disease) as evidenced by estimated needs.  Ongoing  GOAL:   Patient will meet greater than or equal to 90% of their needs  Met with TF  MONITOR:   Diet advancement, Labs, Weight trends, Skin, TF tolerance, I & O's  REASON FOR ASSESSMENT:   Consult Enteral/tube feeding initiation and management  ASSESSMENT:   63 y.o. female with PMH: CHF, thyroid disease, smoker and nephrolithiasis. Admitted to ED severely hyponatremic with volume overload causing acute encephalopathy. Recent [facility] admit in August 2019 for weight loss.   1/10- pt coughing on clear liquids and transitioned to NPO; s/p BSE- recommend NPO; s/p MBSS- recommend NPO; s/p cortrak placement- tip of tube in stomach; TF initiated   Case discussed with RN, who reports pt is scheduled for a MBSS this afternoon.   Spoke with pt who was much more alert today. She reports that she was re-evaluated by SLP today and was very excited to drink coffee. She denies any nausea, vomiting, or abdominal pain with her TF, however, is hopeful that cortrak can be removed after swallow study.   TF of Jevity 1.2 previously infusing via cortrak at 50 ml/hr (cutrrently unhooked for procedure). Regimen provides 1440 kcals, 67 grams protein, and 968 ml free water daily, which meets 100% of estimated nutritional needs.   Labs reviewed: Na: 133, CBGS: 121-155.   Diet Order:   Diet Order            Diet NPO time specified  Diet effective now              EDUCATION NEEDS:   Not appropriate for education at this time  Skin:  Skin Assessment: Reviewed RN Assessment  Last BM:  PTA  Height:   Ht Readings from Last 1  Encounters:  09/29/18 _0  (1.549 m)    Weight:   Wt Readings from Last 1 Encounters:  10/05/18 50.9 kg    Ideal Body Weight:  47 kg  BMI:  Body mass index is 21.2 kg/m.  Estimated Nutritional Needs:   Kcal:  1300-1500 kcal/d   Protein:  65-75 g/d  Fluid:  1200 mL per MD    Tempie Gibeault A. Jimmye Norman, RD, LDN, CDE Pager: [phone] After hours Pager: [phone]

## 2018-10-05 NOTE — Progress Notes (Signed)
Modified Barium Swallow Progress Note  Patient Details  Name: Kim Wagner MRN: [id number] Date of Birth: 06-Jun-1956  Today's Date: 10/05/2018  Modified Barium Swallow completed.  Full report located under Chart Review in the Imaging Section.  Brief recommendations include the following:  Clinical Impression  Pt was seen in radiology for modified barium swallow study. She was seated upright and the study was performed in lateral view. Trials of puree solids, regular texture solids, thin liquids, and nectar thick liquids were administered. Pt presents with oropharyngeal dysphagia characterized by premature spillage, a pharyngeal delay, penetration with thin liquids, nectar thick liquids and puree, and aspiration  with thin liquids via straw. Reducing bolus size and using a chin tuck posture were effective in eliminating instances of aspiration and reducing depth of penetration. Pt did sense a "tickle" following instances of aspiration. Prompted coughing effectively expelled penetrated material but no functional benefit was noted when this strategy was used with instances of aspiration. Considering the pt's improved mentation and family's report of  symptoms prior to admission, the pt's presentation appears to be chronic in nature and not solely due to encephalopathy. It is also noteworthy that the presence of kyphosis may be a contributing factor to the pt's consistent penetration. A dysphagia 3 diet with thin liquids is recommended at this time with consistent use of compensatory strategies (i.e., reduced bolus size and chin tuck posture with solids, liquids, and meds).    Swallow Evaluation Recommendations       SLP Diet Recommendations: Dysphagia 3 (Mech soft) solids;Thin liquid   Liquid Administration via: Cup   Medication Administration: Whole meds with puree   Supervision: Patient able to self feed;Intermittent supervision to cue for compensatory strategies   Compensations: Small  sips/bites;Chin tuck   Postural Changes: Remain semi-upright after after feeds/meals (Comment);Seated upright at 90 degrees   Oral Care Recommendations: Oral care BID       [name] 10/05/2018,4:27 PM   Yvone Neu I. Vear Clock, MS, CCC-SLP Acute Rehabilitation Services Office number [phone] Pager [phone]

## 2018-10-05 NOTE — Progress Notes (Signed)
  Speech Language Pathology Treatment: Dysphagia  Patient Details Name: Kim Wagner MRN: [id number] DOB: 1956-03-21 Today's Date: 10/05/2018 Time: 1105-1130 SLP Time Calculation (min) (ACUTE ONLY): 25 min  Assessment / Plan / Recommendation Clinical Impression  Pt's alertness and orientation were notably improved compared to that which was noted on 10/02/18. She tolerated puree solids, regular texture solids, and thin liquids via straw using individual and consecutive swallows without overt s/sx of aspiration, Mastication time was within functional limits and no significant oral residue was noted. Pt's swallow function appears notably improved compared to that of 10/02/18. However, considering the reduced laryngeal sensation noted during her most recent modified barium swallow study and her daughter's report of chronic coughing with p.o. intake prior to admission, it recommended that a repeat modified barium swallow study be conducted prior to initiating p.o. intake. MBS is scheduled for 1430   HPI HPI: Pt is a 63 year old female history of diastolic HF, nephrolithiasis, hypertension who was brought in to ED secondary to confusion and minimal responsiveness.  She was found to be severely hyponatremic with volume overload causing acute encephalopathy. Chest x-ray and CT of the head of 09/29/18 were negative. SLP was consulted due to pt demonstrating coughing with thin liquids.       SLP Plan  MBS       Recommendations  Liquids provided via: Straw;Cup Medication Administration: Whole meds with puree Supervision: Patient able to self feed                Oral Care Recommendations: Oral care QID SLP Visit Diagnosis: Dysphagia, unspecified (R13.10) Plan: MBS                       [name] 10/05/2018, 11:59 AM  Kim Wagner Clock, MS, CCC-SLP Acute Rehabilitation Services Office number [phone] Pager [phone]

## 2018-10-05 NOTE — Plan of Care (Signed)

## 2018-10-06 LAB — BASIC METABOLIC PANEL
Anion gap: 8 (ref 5–15)
BUN: 19 mg/dL (ref 8–23)
CALCIUM: 9.4 mg/dL (ref 8.9–10.3)
CO2: 30 mmol/L (ref 22–32)
Chloride: 95 mmol/L — ABNORMAL LOW (ref 98–111)
Creatinine, Ser: 0.54 mg/dL (ref 0.44–1.00)
Glucose, Bld: 90 mg/dL (ref 70–99)
Potassium: 4 mmol/L (ref 3.5–5.1)
Sodium: 133 mmol/L — ABNORMAL LOW (ref 135–145)

## 2018-10-06 LAB — GLUCOSE, CAPILLARY
Glucose-Capillary: 93 mg/dL (ref 70–99)
Glucose-Capillary: 87 mg/dL (ref 70–99)
Glucose-Capillary: 99 mg/dL (ref 70–99)
Glucose-Capillary: 94 mg/dL (ref 70–99)
Glucose-Capillary: 120 mg/dL — ABNORMAL HIGH (ref 70–99)

## 2018-10-06 MED ORDER — FUROSEMIDE 20 MG PO TABS: 20.0000 mg | ORAL_TABLET | Freq: Every day | ORAL | 0 refills | Status: AC

## 2018-10-06 MED ORDER — BOOST / RESOURCE BREEZE PO LIQD CUSTOM
1.0000 | Freq: Three times a day (TID) | ORAL | Status: DC
  Administered 2018-10-06 (×2): 1 via ORAL

## 2018-10-06 MED ORDER — ADULT MULTIVITAMIN W/MINERALS CH
1.0000 | ORAL_TABLET | Freq: Every day | ORAL | Status: DC
  Administered 2018-10-06: 1 via ORAL
  Filled 2018-10-06: qty 1

## 2018-10-06 MED ORDER — NICOTINE 21 MG/24HR TD PT24: 21.0000 mg | MEDICATED_PATCH | Freq: Every day | TRANSDERMAL | 0 refills | Status: AC

## 2018-10-06 MED ORDER — ALBUTEROL SULFATE (2.5 MG/3ML) 0.083% IN NEBU: 2.5000 mg | INHALATION_SOLUTION | RESPIRATORY_TRACT | Status: DC | PRN

## 2018-10-06 NOTE — Discharge Instructions (Addendum)

## 2018-10-06 NOTE — Plan of Care (Signed)

## 2018-10-06 NOTE — Progress Notes (Signed)
  Speech Language Pathology Treatment: Dysphagia  Patient Details Name: Kim Wagner MRN: [id number] DOB: 1956/03/26 Today's Date: 10/06/2018 Time: 9381-0175 SLP Time Calculation (min) (ACUTE ONLY): 16 min  Assessment / Plan / Recommendation Clinical Impression  Pt was seen during lunch to assess tolerance of the current diet and to facilitate consistent use of compensatory strategies. She consumed a meal of asparagus, chicken and dumplings, a dinner roll, and thin liquids via cup. She tolerated all solids and liquids without overt s/sx of aspiration. Mastication time was within functional limits and she denied sensation of the "tickle" which she described yesterday when aspiration was noted on the MBS. Pt required occasional (1-2) cues to demonstrate a complete chin tuck posture but she was independent with use of  reduced bolus sizes. Pt reported that she will be discharged today and she was educated that continued skilled SLP services are recommended following discharge. She verbalized understanding regarding this and interest in participating in further therapy.    HPI HPI: Pt is a 63 year old female history of diastolic HF, nephrolithiasis, hypertension who was brought in to ED secondary to confusion and minimal responsiveness.  She was found to be severely hyponatremic with volume overload causing acute encephalopathy. Chest x-ray and CT of the head of 09/29/18 were negative. SLP was consulted due to pt demonstrating coughing with thin liquids.       SLP Plan  Continue with current plan of care       Recommendations  Diet recommendations: Dysphagia 3 (mechanical soft);Thin liquid Liquids provided via: Cup Medication Administration: Whole meds with puree Supervision: Patient able to self feed Compensations: Small sips/bites;Chin tuck;Slow rate Postural Changes and/or Swallow Maneuvers: Chin tuck;Upright 30-60 min after meal;Seated upright 90 degrees                Oral Care  Recommendations: Oral care BID Follow up Recommendations: Outpatient SLP SLP Visit Diagnosis: Dysphagia, oropharyngeal phase (R13.12) Plan: Continue with current plan of care             [name] 10/06/2018, 12:37 PM  Yvone Neu I. Vear Clock, MS, CCC-SLP Acute Rehabilitation Services Office number [phone] Pager [phone]

## 2018-10-06 NOTE — Discharge Summary (Signed)
Physician Discharge Summary  Kim Wagner ZOX:[id number] DOB: [birth year]  PCP: Hadley Pen, MD  Admit date: 09/29/2018 Discharge date: 10/06/2018  Recommendations for Outpatient Follow-up:  1. Dr. Keturah Barre, PCP in 1 week with repeat labs (CBC & BMP). 2. Please follow vitamin B1 levels that were sent from the hospital.  Home Health: PT Equipment/Devices: None  Discharge Condition: Improved and stable CODE STATUS: Full Diet recommendation: Heart healthy diet and diet consistency as per speech therapy recommendations as follows:  SLP Diet Recommendations: Dysphagia 3 (Mech soft) solids;Thin liquid   Liquid Administration via: Cup   Medication Administration: Whole meds with puree   Supervision: Patient able to self feed;Intermittent supervision to cue for compensatory strategies   Compensations: Small sips/bites;Chin tuck   Postural Changes: Remain semi-upright after after feeds/meals (Comment);Seated upright at 90 degrees   Oral Care Recommendations: Oral care BID  Discharge Diagnoses:  Principal Problem:   Hyponatremia Active Problems:   Acute encephalopathy   Malnutrition of moderate degree   Acute on chronic diastolic (congestive) heart failure [facility])   Brief Summary: 63 year old female with PMH of Chronic diastolic heart failure, nephrolithiasis, HTN, was brought to the hospital on 09/29/2018 with severe hyponatremia (sodium 116) and confusion.  She was admitted by CCM, treated with 3% saline and diuresed with Lasix with improvement in sodium.  She was transferred to [facility] 1/9.  Still confused.  Dysphagia, failed swallow eval.  Cor track placed 1/10 and tube feeding initiated.  Sodium has improved and back to baseline/133.  Mental status changes resolved.  Modified diet started by speech therapy on 1/13.  Assessment & Plan:   Hyponatremia/SIADH -As per chart review, patient has been admitted previously for symptomatic hyponatremia. -Presented with sodium of  116.  Baseline sodium in the high 120s-low 130s over the past year. -This is acute on chronic hyponatremia. -She was clinically volume overloaded on admission with 2+ pitting lower extremity edema, very mild JVD but chest x-ray without edema. -No history of excessive water intake.  No history of SSRI use.  TSH normal. -Reportedly had multiple CTs including head, chest and abdomen that did not suggest underlying malignancy. -Treated briefly with 3% normal saline and IV Lasix. -Sodium improved.  Hypertonic saline discontinued. -Urine sodium 43, urine osmolarity 387, serum osmolarity 252 suggestive of SIADH. -Periphery does not have any excess volume. -I discussed in detail with nephrologist on 1/10 and as per recommendations, continue Lasix 20 mg daily, added sodium chloride tablets 2 g 3 times daily, continue fluid restriction. -Sodium is improved to 133.  Discontinue salt tablets and monitor BMP. -Sodium remained stable at 133.  Fluid restriction advised.  Continue low-dose Lasix.  Close outpatient follow-up with PCP with repeat BMP.  Acute on chronic diastolic CHF. -Now without peripheral excess volume.  Continue Lasix 20 mg daily.  Hypertensive urgency -Patient was not on any antihypertensives PTA.  She was not taking ACEI.  Blood pressures in the hospital are mostly controlled off of medications.  Outpatient follow-up to determine if she needs initiation of any antihypertensives.  Acute metabolic encephalopathy -Multifactorial but most likely due to hyponatremia complicating underlying mild dementia, sleep deprivation, hospital delirium.    B12: 365, resume home B12 supplements at discharge.   Thiamine levels are pending.  She received IV thiamine 500 mg daily x3 days.  Resume prior thiamine supplements at home.  Delirium precautions.  She also received Trazodone at bedtime for sleep for a couple of days.   -Mental status changes have resolved and  mental status now is back to baseline as  per discussion with her daughter.  COPD/tobacco abuse:  - Stable without clinical bronchospasm. -Needs to quit smoking.  Continue nicotine patch.  Dysphagia -Patient and daughter report swallowing difficulties PTA.  She failed swallow evaluation on Friday and was started on tube feeds via core track. -Speech therapy reassessed yesterday and placed her on modified diet as above which she is tolerating well.  Continue same at discharge.   Consultants:  CCM-signed off  Procedures:  Cor track placed 1/10.   Discharge Instructions  Discharge Instructions    Call MD for:   Complete by:  As directed    Recurrent confusion or altered mental status.   Call MD for:  extreme fatigue   Complete by:  As directed    Call MD for:  persistant dizziness or light-headedness   Complete by:  As directed    Diet - low sodium heart healthy   Complete by:  As directed    Speech therapy diet Recommendations:  Dysphagia 3 (Mech soft) solids;Thin liquid Liquid Administration via: Cup Medication Administration: Whole meds with puree Supervision: Patient able to self feed;Intermittent supervision to cue for compensatory strategies Compensations: Small sips/bites;Chin tuck Postural Changes: Remain semi-upright after after feeds/meals (Comment);Seated upright at 90 degrees   Increase activity slowly   Complete by:  As directed        Medication List    STOP taking these medications   ramipril 1.25 MG capsule Commonly known as:  ALTACE     TAKE these medications   aspirin EC 81 MG tablet Take 81 mg by mouth at bedtime.   CENTRUM WOMEN Tabs Take 1 tablet by mouth daily.   ferrous sulfate 325 (65 FE) MG tablet Take 1 tablet (325 mg total) by mouth 2 (two) times daily with a meal.   furosemide 20 MG tablet Commonly known as:  LASIX Take 1 tablet (20 mg total) by mouth daily. What changed:    medication strength  how much to take   gabapentin 300 MG capsule Commonly known as:   NEURONTIN Take 300 mg by mouth at bedtime.   nicotine 21 mg/24hr patch Commonly known as:  NICODERM CQ - dosed in mg/24 hours Place 1 patch (21 mg total) onto the skin daily. Start taking on:  October 07, 2018   thiamine 100 MG tablet Take 1 tablet (100 mg total) by mouth daily.   vitamin B-12 500 MCG tablet Commonly known as:  CYANOCOBALAMIN Take 1 tablet (500 mcg total) by mouth daily.      Follow-up Information    Hadley Penobbins, Robert A, MD. Schedule an appointment as soon as possible for a visit in 1 week(s).   Specialty:  Family Medicine Why:  To be seen with repeat labs (CBC & BMP). Contact information: [address] [place] [address] [phone]        Lyn RecordsSmith, Henry W, MD .   Specialty:  Cardiology Contact information: [phone] [address] [place] [address] [phone]          Allergies  Allergen Reactions  . Penicillins Rash    Has patient had a PCN reaction causing immediate rash, facial/tongue/throat swelling, SOB or lightheadedness with hypotension: Yes Has patient had a PCN reaction causing severe rash involving mucus membranes or skin necrosis: Unk Has patient had a PCN reaction that required hospitalization: No Has patient had a PCN reaction occurring within the last 10 years: No If all of the  above answers are "NO", then may proceed with Cephalosporin use.       Procedures/Studies: Ct Cervical Spine Wo Contrast  Result Date: 09/29/2018 CLINICAL DATA:  Altered mental status.  Found down. EXAM: CT CERVICAL SPINE WITHOUT CONTRAST TECHNIQUE: Multidetector CT imaging of the cervical spine was performed without intravenous contrast. Multiplanar CT image reconstructions were also generated. COMPARISON:  Head and neck CTA 12/06/2017. Spine radiographs 09/02/2018. FINDINGS: There is moderate motion artifact through the mid to upper cervical spine. Alignment: Mildly exaggerated cervical lordosis. Chronic trace anterolisthesis of C4 on C5.  Skull base and vertebrae: No acute fracture identified within limitations of motion. No destructive osseous lesion. Soft tissues and spinal canal: No prevertebral fluid or swelling. No visible canal hematoma. Disc levels: Severe facet arthrosis bilaterally at C4-5 and on the left at C5-6. Upper chest: No apical lung consolidation or mass. Other: None. IMPRESSION: No acute cervical spine fracture identified on this motion degraded examination. Electronically Signed   By: Sebastian Ache M.D.   On: 09/29/2018 17:39   Dg Chest Port 1 View  Result Date: 09/29/2018 CLINICAL DATA:  Shortness of breath. EXAM: PORTABLE CHEST 1 VIEW COMPARISON:  CT chest 05/23/2018. Chest x-ray 05/22/2018, 01/02/2018. FINDINGS: Patient rotated to the right. Stable aortic tortuosity noted. Stable cardiomegaly. No pulmonary venous congestion. No focal infiltrate. No pleural effusion or pneumothorax. Degenerative change thoracic spine. IMPRESSION: 1. Stable thoracic aortic tortuosity and cardiomegaly. No pulmonary venous congestion. 2.  No acute pulmonary disease. Electronically Signed   By: Maisie Fus  Register   On: 09/29/2018 15:44   Dg Swallowing Func-speech Pathology  Result Date: 10/05/2018 Objective Swallowing Evaluation: Type of Study: MBS-Modified Barium Swallow Study  Patient Details Name: Deliana Avalos MRN: [id number] Date of Birth: 1955/12/08 Today's Date: 10/05/2018 Time: SLP Start Time (ACUTE ONLY): 1440 -SLP Stop Time (ACUTE ONLY): 1510 SLP Time Calculation (min) (ACUTE ONLY): 30 min Past Medical History: Past Medical History: Diagnosis Date . Acute on chronic diastolic (congestive) heart failure (HCC)  . Chest discomfort   ONLY "IF RUNNING TO PLAY WITH GRANDCHILDREN" - PT IS A SMOKER AND HAS STRONG FAMILY HX OF HEART PROBLEMS AND WILL HAVE NUCLEAR STRESS TEST TUESDAY 06/01/13 FOR CLEARANCE FOR PLANNED URETEROSCOPY, LASER LITHO SURGERY ON 9/15. Marland Kitchen History of kidney stones  Past Surgical History: Past Surgical History: Procedure  Laterality Date . ABDOMINAL HYSTERECTOMY   . CYSTOSCOPY W/ RETROGRADES Left 04/18/2013  Procedure: CYSTOSCOPY WITH LEFT RETROGRADE PYELOGRAM; URETER STENT PLACEMENT;  Surgeon: Milford Cage, MD;  Location: [facility] OR;  Service: Urology;  Laterality: Left; . CYSTOSCOPY WITH RETROGRADE PYELOGRAM, URETEROSCOPY AND STENT PLACEMENT Left 06/07/2013  Procedure: CYSTOSCOPY, BILATERAL RETROGRADE PYELOGRAM, LEFT URETEROSCOPY, LEFT LASER LITHOTRIPSY, LEFT URETER STENT EXCHANGE;  Surgeon: Milford Cage, MD;  Location: [facility] ORS;  Service: Urology;  Laterality: Left;  CYSTOSCOPY, BILATERAL RETROGRADE PYELOGRAM, LEFT URETEROSCOPY, LEFT LASER LITHOTRIPSY, LEFT URETER STENT EXCHANGE  . HOLMIUM LASER APPLICATION Left 06/07/2013  Procedure: HOLMIUM LASER APPLICATION;  Surgeon: Milford Cage, MD;  Location: [facility] ORS;  Service: Urology;  Laterality: Left; . right ankle surgery   . SHOULDER SURGERY Left  HPI: Pt is a 63 year old female history of diastolic HF, nephrolithiasis, hypertension who was brought in to ED secondary to confusion and minimal responsiveness.  She was found to be severely hyponatremic with volume overload causing acute encephalopathy. Chest x-ray and CT of the head of 09/29/18 were negative. SLP was consulted due to pt demonstrating coughing with thin liquids.  No data recorded Assessment / Plan /  Recommendation CHL IP CLINICAL IMPRESSIONS 10/05/2018 Clinical Impression Pt was seen in radiology for modified barium swallow study. She was seated upright and the study was performed in lateral view. Trials of puree solids, regular texture solids, thin liquids, and nectar thick liquids were administered. Pt presents with oropharyngeal dysphagia characterized by premature spillage, a pharyngeal delay, penetration with thin liquids, nectar thick liquids and puree, and aspiration  with thin liquids via straw. Reducing bolus size and using a chin tuck posture were effective in eliminating instances of aspiration and  reducing depth of penetration. Pt did sense a "tickle" following instances of aspiration. Prompted coughing effectively expelled penetrated material but no functional benefit was noted when this strategy was used with instances of aspiration. Considering the pt's improved mentation and family's report of  symptoms prior to admission, the pt's presentation appears to be chronic in nature and not solely due to encephalopathy. It is also noteworthy that the presence of kyphosis may be a contributing factor to the pt's consistent penetration. A dysphagia 3 diet with thin liquids is recommended at this time with consistent use of compensatory strategies (i.e., reduced bolus size and chin tuck posture with solids, liquids, and meds).  SLP Visit Diagnosis Dysphagia, oropharyngeal phase (R13.12) Attention and concentration deficit following -- Frontal lobe and executive function deficit following -- Impact on safety and function Mild aspiration risk;Moderate aspiration risk   CHL IP TREATMENT RECOMMENDATION 10/05/2018 Treatment Recommendations Patient unable to participate in swallow therapy at this time   Prognosis 10/05/2018 Prognosis for Safe Diet Advancement Good Barriers to Reach Goals -- Barriers/Prognosis Comment -- CHL IP DIET RECOMMENDATION 10/05/2018 SLP Diet Recommendations Dysphagia 3 (Mech soft) solids;Thin liquid Liquid Administration via Cup Medication Administration Whole meds with puree Compensations Small sips/bites;Chin tuck Postural Changes Remain semi-upright after after feeds/meals (Comment);Seated upright at 90 degrees   CHL IP OTHER RECOMMENDATIONS 10/05/2018 Recommended Consults -- Oral Care Recommendations Oral care BID Other Recommendations --   CHL IP FOLLOW UP RECOMMENDATIONS 10/02/2018 Follow up Recommendations Other (comment)   CHL IP FREQUENCY AND DURATION 10/05/2018 Speech Therapy Frequency (ACUTE ONLY) min 2x/week Treatment Duration 2 weeks      CHL IP ORAL PHASE 10/05/2018 Oral Phase Impaired  Oral - Pudding Teaspoon -- Oral - Pudding Cup -- Oral - Honey Teaspoon -- Oral - Honey Cup -- Oral - Nectar Teaspoon WFL Oral - Nectar Cup -- Oral - Nectar Straw WFL Oral - Thin Teaspoon Premature spillage Oral - Thin Cup WFL Oral - Thin Straw WFL Oral - Puree WFL Oral - Mech Soft -- Oral - Regular -- Oral - Multi-Consistency -- Oral - Pill -- Oral Phase - Comment --  CHL IP PHARYNGEAL PHASE 10/05/2018 Pharyngeal Phase Impaired Pharyngeal- Pudding Teaspoon -- Pharyngeal -- Pharyngeal- Pudding Cup -- Pharyngeal -- Pharyngeal- Honey Teaspoon -- Pharyngeal -- Pharyngeal- Honey Cup -- Pharyngeal -- Pharyngeal- Nectar Teaspoon Penetration/Aspiration during swallow;Delayed swallow initiation-vallecula Pharyngeal Material enters airway, remains ABOVE vocal cords and not ejected out Pharyngeal- Nectar Cup Penetration/Aspiration during swallow;Delayed swallow initiation-vallecula;Pharyngeal residue - valleculae Pharyngeal Material enters airway, remains ABOVE vocal cords and not ejected out Pharyngeal- Nectar Straw Delayed swallow initiation-vallecula;Penetration/Aspiration during swallow Pharyngeal Material enters airway, remains ABOVE vocal cords and not ejected out Pharyngeal- Thin Teaspoon Delayed swallow initiation-pyriform sinuses;Penetration/Aspiration during swallow;Penetration/Apiration after swallow Pharyngeal Material enters airway, CONTACTS cords and not ejected out;Material enters airway, passes BELOW cords and not ejected out despite cough attempt by patient Pharyngeal- Thin Cup Penetration/Apiration after swallow;Penetration/Aspiration during swallow;Delayed swallow initiation-pyriform sinuses Pharyngeal Material  enters airway, CONTACTS cords and not ejected out;Material enters airway, passes BELOW cords and not ejected out despite cough attempt by patient Pharyngeal- Thin Straw Delayed swallow initiation-pyriform sinuses;Penetration/Apiration after swallow Pharyngeal Material enters airway, CONTACTS cords and  not ejected out;Material enters airway, passes BELOW cords and not ejected out despite cough attempt by patient Pharyngeal- Puree Delayed swallow initiation-vallecula;Penetration/Aspiration during swallow Pharyngeal Material enters airway, remains ABOVE vocal cords and not ejected out Pharyngeal- Mechanical Soft -- Pharyngeal -- Pharyngeal- Regular Delayed swallow initiation-vallecula Pharyngeal -- Pharyngeal- Multi-consistency -- Pharyngeal -- Pharyngeal- Pill -- Pharyngeal -- Pharyngeal Comment Chin tuck posture eliminated aspiration and reduced depth of penetration.   CHL IP CERVICAL ESOPHAGEAL PHASE 10/05/2018 Cervical Esophageal Phase WFL Pudding Teaspoon -- Pudding Cup -- Honey Teaspoon -- Honey Cup -- Nectar Teaspoon -- Nectar Cup -- Nectar Straw -- Thin Teaspoon -- Thin Cup -- Thin Straw -- Puree -- Mechanical Soft -- Regular -- Multi-consistency -- Pill -- Cervical Esophageal Comment -- [name] 10/05/2018, 4:11 PM  Shanika I. Vear Clock, MS, CCC-SLP Acute Rehabilitation Services Office number [phone] Pager [phone]             Dg Swallowing Func-speech Pathology  Result Date: 10/02/2018 Objective Swallowing Evaluation: Type of Study: MBS-Modified Barium Swallow Study  Patient Details Name: Charlese Gruetzmacher MRN: [id number] Date of Birth: 25-Feb-1956 Today's Date: 10/02/2018 Time: SLP Start Time (ACUTE ONLY): 1400 -SLP Stop Time (ACUTE ONLY): 1500 SLP Time Calculation (min) (ACUTE ONLY): 60 min Past Medical History: Past Medical History: Diagnosis Date . Acute on chronic diastolic (congestive) heart failure (HCC)  . Chest discomfort   ONLY "IF RUNNING TO PLAY WITH GRANDCHILDREN" - PT IS A SMOKER AND HAS STRONG FAMILY HX OF HEART PROBLEMS AND WILL HAVE NUCLEAR STRESS TEST TUESDAY 06/01/13 FOR CLEARANCE FOR PLANNED URETEROSCOPY, LASER LITHO SURGERY ON 9/15. Marland Kitchen History of kidney stones  Past Surgical History: Past Surgical History: Procedure Laterality Date . ABDOMINAL HYSTERECTOMY   . CYSTOSCOPY W/  RETROGRADES Left 04/18/2013  Procedure: CYSTOSCOPY WITH LEFT RETROGRADE PYELOGRAM; URETER STENT PLACEMENT;  Surgeon: Milford Cage, MD;  Location: [facility] OR;  Service: Urology;  Laterality: Left; . CYSTOSCOPY WITH RETROGRADE PYELOGRAM, URETEROSCOPY AND STENT PLACEMENT Left 06/07/2013  Procedure: CYSTOSCOPY, BILATERAL RETROGRADE PYELOGRAM, LEFT URETEROSCOPY, LEFT LASER LITHOTRIPSY, LEFT URETER STENT EXCHANGE;  Surgeon: Milford Cage, MD;  Location: [facility];  Service: Urology;  Laterality: Left;  CYSTOSCOPY, BILATERAL RETROGRADE PYELOGRAM, LEFT URETEROSCOPY, LEFT LASER LITHOTRIPSY, LEFT URETER STENT EXCHANGE  . HOLMIUM LASER APPLICATION Left 06/07/2013  Procedure: HOLMIUM LASER APPLICATION;  Surgeon: Milford Cage, MD;  Location: [facility];  Service: Urology;  Laterality: Left; . right ankle surgery   . SHOULDER SURGERY Left  HPI: Pt is a 63 year old female history of diastolic HF, nephrolithiasis, hypertension who was brought in to ED secondary to confusion and minimal responsiveness.  She was found to be severely hyponatremic with volume overload causing acute encephalopathy. Chest x-ray and CT of the head of 09/29/18 were negative. SLP was consulted due to pt demonstrating coughing with thin liquids.  No data recorded Assessment / Plan / Recommendation CHL IP CLINICAL IMPRESSIONS 10/02/2018 Clinical Impression Pt presents with a likely transient dysphagia related to encephalopathy - however, degree of dysphagia was surprisingly severe given lack of other comorbidities.  Pt presented with adequate oral phase, but swallow response time was consistently delayed, allowing all POs - purees, nectar-thick liquids, and thin liquids - to enter the laryngeal vestibule and transition to vocal folds prior to execution of  laryngeal vestibule closure.  When aspiration occurred, pt generally did not sense its presence, and a spontaneous cough was not elicited.  Pt required cues to cough, which did not eject aspirate  from trachea.  Pt unable to follow commands for various compensatory strategies.  Given high risk of aspiration of all consistencies, recommend NPO with cortrak for now.  Allow ice chips after oral care. SLP will follow for readiness for repeat evaluation. Anticipate concomitant improvement in swallowing as mentation clears. D/W Dr. Waymon Amato.  SLP Visit Diagnosis Dysphagia, pharyngeal phase (R13.13) Attention and concentration deficit following -- Frontal lobe and executive function deficit following -- Impact on safety and function Severe aspiration risk   CHL IP TREATMENT RECOMMENDATION 10/02/2018 Treatment Recommendations Therapy as outlined in treatment plan below   Prognosis 10/02/2018 Prognosis for Safe Diet Advancement Good Barriers to Reach Goals Cognitive deficits Barriers/Prognosis Comment -- CHL IP DIET RECOMMENDATION 10/02/2018 SLP Diet Recommendations NPO;Alternative means - temporary;Ice chips PRN after oral care Liquid Administration via -- Medication Administration Via alternative means Compensations -- Postural Changes --   No flowsheet data found.  CHL IP FOLLOW UP RECOMMENDATIONS 10/02/2018 Follow up Recommendations Other (comment)   CHL IP FREQUENCY AND DURATION 10/02/2018 Speech Therapy Frequency (ACUTE ONLY) min 2x/week Treatment Duration 2 weeks      CHL IP ORAL PHASE 10/02/2018 Oral Phase WFL Oral - Pudding Teaspoon -- Oral - Pudding Cup -- Oral - Honey Teaspoon -- Oral - Honey Cup -- Oral - Nectar Teaspoon WFL Oral - Nectar Cup -- Oral - Nectar Straw WFL Oral - Thin Teaspoon WFL Oral - Thin Cup -- Oral - Thin Straw WFL Oral - Puree WFL Oral - Mech Soft -- Oral - Regular -- Oral - Multi-Consistency -- Oral - Pill -- Oral Phase - Comment --  CHL IP PHARYNGEAL PHASE 10/02/2018 Pharyngeal Phase Impaired Pharyngeal- Pudding Teaspoon -- Pharyngeal -- Pharyngeal- Pudding Cup -- Pharyngeal -- Pharyngeal- Honey Teaspoon -- Pharyngeal -- Pharyngeal- Honey Cup -- Pharyngeal -- Pharyngeal- Nectar Teaspoon  Delayed swallow initiation-pyriform sinuses;Penetration/Aspiration before swallow;Pharyngeal residue - valleculae;Pharyngeal residue - pyriform Pharyngeal Material enters airway, CONTACTS cords and not ejected out Pharyngeal- Nectar Cup -- Pharyngeal -- Pharyngeal- Nectar Straw Delayed swallow initiation-pyriform sinuses;Penetration/Aspiration before swallow;Pharyngeal residue - valleculae;Pharyngeal residue - pyriform Pharyngeal Material enters airway, CONTACTS cords and not ejected out Pharyngeal- Thin Teaspoon Delayed swallow initiation-pyriform sinuses;Penetration/Aspiration before swallow;Moderate aspiration;Pharyngeal residue - valleculae;Pharyngeal residue - pyriform Pharyngeal Material enters airway, passes BELOW cords and not ejected out despite cough attempt by patient Pharyngeal- Thin Cup -- Pharyngeal -- Pharyngeal- Thin Straw Delayed swallow initiation-pyriform sinuses;Penetration/Aspiration before swallow;Moderate aspiration;Pharyngeal residue - valleculae;Pharyngeal residue - pyriform Pharyngeal Material enters airway, passes BELOW cords and not ejected out despite cough attempt by patient Pharyngeal- Puree Delayed swallow initiation-vallecula;Penetration/Aspiration before swallow;Pharyngeal residue - valleculae;Pharyngeal residue - pyriform Pharyngeal Material enters airway, remains ABOVE vocal cords and not ejected out Pharyngeal- Mechanical Soft -- Pharyngeal -- Pharyngeal- Regular -- Pharyngeal -- Pharyngeal- Multi-consistency -- Pharyngeal -- Pharyngeal- Pill -- Pharyngeal -- Pharyngeal Comment --  CHL IP CERVICAL ESOPHAGEAL PHASE 10/02/2018 Cervical Esophageal Phase (No Data) Pudding Teaspoon -- Pudding Cup -- Honey Teaspoon -- Honey Cup -- Nectar Teaspoon -- Nectar Cup -- Nectar Straw -- Thin Teaspoon -- Thin Cup -- Thin Straw -- Puree -- Mechanical Soft -- Regular -- Multi-consistency -- Pill -- Cervical Esophageal Comment -- [name] 10/02/2018, 3:15 PM              Ct Head  Code Stroke Wo Contrast  Result Date: 09/29/2018 CLINICAL DATA:  Code stroke. Code stroke. Altered level of consciousness. EXAM: CT HEAD WITHOUT CONTRAST TECHNIQUE: Contiguous axial images were obtained from the base of the skull through the vertex without intravenous contrast. 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 No acute infarct, hemorrhage, or mass lesion is present. No acute cortical infarct is present. Basal ganglia are intact bilaterally. Insular ribbon is normal. The brainstem and cerebellum are normal. Vascular: Intracranial vessels are diffusely hyperdense. There is no focal asymmetry. Vascular calcifications are present within the cavernous internal carotid arteries. Skull: Calvarium is intact. No focal lytic or blastic lesions are present. Sinuses/Orbits: The paranasal sinuses and mastoid air cells are clear. The globes and orbits are within normal limits. ASPECTS [facility]([place]ta Stroke Program Early CT Score) - Ganglionic level infarction (caudate, lentiform nuclei, internal capsule, insula, M1-M3 cortex): 7/7 - Supraganglionic infarction (M4-M6 cortex): 3/3 Total score (0-10 with 10 being normal): 10/10 IMPRESSION: 1. No acute intracranial abnormality or significant interval change. 2. Stable white matter disease. 3. Intracranial vessels are diffusely hyperdense. Question increased hemoglobin. 4. ASPECTS is 10/10 Electronically Signed   By: Marin Robertshristopher  Mattern M.D.   On: 09/29/2018 14:48      Subjective: Patient denies complaints.  Eating well.  As per RN, no acute issues noted.  As per discussion with daughter, mental status has returned to baseline.  Discharge Exam:  Vitals:   10/05/18 1942 10/05/18 2027 10/06/18 0356 10/06/18 0748  BP:  121/84 (!) 128/94 130/84  Pulse: 82   (!) 53  Resp: 16   18  Temp:  98.7 F (37.1 C) 98.9 F (37.2 C) 98.3 F (36.8 C)  TempSrc:  Oral Oral Oral  SpO2:  98%   92%  Weight:   50.9 kg   Height:        General exam: Pleasant middle-aged female, moderately built, thinly nourished, sitting up comfortably in chair.  Respiratory system: Clear to auscultation. Respiratory effort normal.   Cardiovascular system: S1 & S2 heard, RRR. No JVD, murmurs, rubs, gallops or clicks. No pedal edema.   Gastrointestinal system: Abdomen is nondistended, soft and nontender. No organomegaly or masses felt. Normal bowel sounds heard. Central nervous system: Alert and oriented x4.  No focal neurological deficits.  Extremities: Symmetric 5 x 5 power. Skin: No rashes, lesions or ulcers Psychiatry: Judgement and insight intact. Mood & affect appropriate.      The results of significant diagnostics from this hospitalization (including imaging, microbiology, ancillary and laboratory) are listed below for reference.     Microbiology: Recent Results (from the past 240 hour(s))  MRSA PCR Screening     Status: None   Collection Time: 09/29/18  8:23 PM  Result Value Ref Range Status   MRSA by PCR NEGATIVE NEGATIVE Final    Comment:        The GeneXpert MRSA Assay (FDA approved for NASAL specimens only), is one component of a comprehensive MRSA colonization surveillance program. It is not intended to diagnose MRSA infection nor to guide or monitor treatment for MRSA infections. Performed at [facility], [address] [place], [place] [address]      Labs: CBC: Recent Labs  Lab 09/29/18 1429 09/29/18 1445 09/30/18 0545 10/01/18 0645  WBC 11.5*  --  11.1* 9.9  NEUTROABS 9.9*  --   --   --   HGB 13.9 15.6* 12.9 12.8  HCT 39.5 46.0 37.7  37.4  MCV 84.6  --  84.0 85.8  PLT 357  --  296 275   Basic Metabolic Panel: Recent Labs  Lab 09/30/18 0545  10/02/18 1618 10/03/18 0456 10/04/18 0221 10/05/18 0237 10/06/18 0253  NA 123*   < > 128* 128* 130* 133* 133*  K 3.5   < > 3.5 3.6 3.6 4.4 4.0  CL 84*   < > 89* 90* 92* 94* 95*  CO2 28   < >  30 31 30  33* 30  GLUCOSE 79   < > 112* 124* 129* 116* 90  BUN 10   < > 9 13 14 20 19   CREATININE 0.48   < > 0.53 0.60 0.50 0.56 0.54  CALCIUM 9.1   < > 9.2 9.6 9.3 9.5 9.4  MG 1.7  --   --   --   --   --   --   PHOS 3.8  --   --   --   --   --   --    < > = values in this interval not displayed.   Liver Function Tests: Recent Labs  Lab 09/29/18 1429 10/03/18 0456  AST 42* 22  ALT 18 16  ALKPHOS 82 65  BILITOT 1.0 0.4  PROT 6.7 6.4*  ALBUMIN 3.7 3.2*   BNP (last 3 results) Recent Labs    05/22/18 1800 09/29/18 1508  BNP 353.5* 81.0   CBG: Recent Labs  Lab 10/05/18 1727 10/05/18 2028 10/06/18 0100 10/06/18 0352 10/06/18 0747  GLUCAP 82 135* 93 87 99   Anemia work up Recent Labs    10/03/18 1211  VITAMINB12 365   Urinalysis    Component Value Date/Time   COLORURINE YELLOW 09/29/2018 1508   APPEARANCEUR CLEAR 09/29/2018 1508   LABSPEC 1.014 09/29/2018 1508   PHURINE 6.0 09/29/2018 1508   GLUCOSEU NEGATIVE 09/29/2018 1508   HGBUR NEGATIVE 09/29/2018 1508   BILIRUBINUR NEGATIVE 09/29/2018 1508   KETONESUR 5 (A) 09/29/2018 1508   PROTEINUR NEGATIVE 09/29/2018 1508   UROBILINOGEN 1.0 04/18/2013 1855   NITRITE NEGATIVE 09/29/2018 1508   LEUKOCYTESUR NEGATIVE 09/29/2018 1508    I discussed with patient's daughter.  Updated care and answered questions.  Time coordinating discharge: 40 minutes  SIGNED:  Marcellus Scott, MD, FACP, [facility]. [facility] Pager [phone]  If 7PM-7AM, please contact night-coverage [email or web address] Password [facility] 10/06/2018, 11:38 AM

## 2018-10-06 NOTE — Progress Notes (Addendum)
Nutrition Follow-up  DOCUMENTATION CODES:   Not applicable  INTERVENTION:   -D/c Jevity 1.2, due to lack of access -Boost Breeze po TID, each supplement provides 250 kcal and 9 grams of protein -MVI with minerals daily  NUTRITION DIAGNOSIS:   Increased nutrient needs related to chronic illness(CHF, smoker, thyroid disease) as evidenced by estimated needs.  Ongoing  GOAL:   Patient will meet greater than or equal to 90% of their needs  Progressing  MONITOR:   PO intake, Supplement acceptance, Diet advancement, Labs, Weight trends, Skin, I & O's  REASON FOR ASSESSMENT:   Consult Enteral/tube feeding initiation and management  ASSESSMENT:   63 y.o. female with PMH: CHF, thyroid disease, smoker and nephrolithiasis. Admitted to ED severely hyponatremic with volume overload causing acute encephalopathy. Recent [facility] admit in August 2019 for weight loss.   1/10-ptcoughing on clear liquids and transitioned to NPO; s/p BSE- recommend NPO; s/p MBSS- recommend NPO; s/p cortrak placement- tip of tube in stomach; TF initiated  1/13- s/p MBSS- advanced to dysphagia 3 diet with thin liquids; cortrak tube removed  Spoke with pt, who is in good spirits today. She is glad to have her cortak tube out she she can eat PO. Pt shares that her appetite is improving and consumed about 2/3 of her breakfast. She recalls disliking the Ensure supplements ("too chalky").   Pt reports improvement in swallow function and was able to teachback swallowing strategies that were presented from SLP. Discussed importance of good meal and supplement intake to promote healing. Pt amenable to Boost Breeze supplements. Also discussed ways to could increase calories and protein in her diet (ex small meals and snacks).   Pt reports she will likely d/c home today.   Reviewed I/O's: +523 ml x 24 hours and +3.3 L since admission  Labs reviewed: Na: 133, CBGS: 87-135.   Diet Order:   Diet Order            DIET  DYS 3 Room service appropriate? Yes; Fluid consistency: Thin  Diet effective now              EDUCATION NEEDS:   Education needs have been addressed  Skin:  Skin Assessment: Reviewed RN Assessment  Last BM:  PTA  Height:   Ht Readings from Last 1 Encounters:  09/29/18 5\' 1"  (1.549 m)    Weight:   Wt Readings from Last 1 Encounters:  10/06/18 50.9 kg    Ideal Body Weight:  47 kg  BMI:  Body mass index is 21.2 kg/m.  Estimated Nutritional Needs:   Kcal:  1300-1500 kcal/d   Protein:  65-75 g/d  Fluid:  1200 mL per MD    Lindley Hiney A. Mayford Knife, RD, LDN, CDE Pager: [phone] After hours Pager: [phone]

## 2018-10-06 NOTE — Plan of Care (Signed)
°  Problem: Education: °Goal: Knowledge of General Education information will improve °Description: Including pain rating scale, medication(s)/side effects and non-pharmacologic comfort measures °Outcome: Progressing °  °Problem: Health Behavior/Discharge Planning: °Goal: Ability to manage health-related needs will improve °Outcome: Progressing °  °Problem: Clinical Measurements: °Goal: Diagnostic test results will improve °Outcome: Progressing °  °Problem: Nutrition: °Goal: Adequate nutrition will be maintained °Outcome: Progressing °  °Problem: Safety: °Goal: Ability to remain free from injury will improve °Outcome: Progressing °  °Problem: Skin Integrity: °Goal: Risk for impaired skin integrity will decrease °Outcome: Progressing °  °

## 2018-10-06 NOTE — Progress Notes (Signed)
Physical Therapy Treatment Patient Details Name: Kim Wagner MRN: [id number] DOB: 04/12/1956 Today's Date: 10/06/2018    History of Present Illness Pt is a 63 year old female history of diastolic HF, nephrolithiasis, hypertension who was brought in to ED secondary to confusion and minimal responsiveness.  She was found to be severely hyponatremic with volume overload causing acute encephalopathy. Chest x-ray and CT of the head of 09/29/18 were negative.    PT Comments    Pt continues to make good progress with mobility. Planning to return home with daughter today.    Follow Up Recommendations  Home health PT;Supervision/Assistance - 24 hour(family able to provide 24 hour supervison)     Equipment Recommendations  None recommended by PT    Recommendations for Other Services       Precautions / Restrictions Precautions Precautions: Fall    Mobility  Bed Mobility               General bed mobility comments: Pt up in chair  Transfers Overall transfer level: Needs assistance Equipment used: None Transfers: Sit to/from [employer] Transfers Sit to Stand: Supervision Stand pivot transfers: Supervision       General transfer comment: supervision for safety  Ambulation/Gait Ambulation/Gait assistance: Supervision Gait Distance (Feet): 350 Feet Assistive device: 4-wheeled walker Gait Pattern/deviations: Step-through pattern;Decreased stride length;Trunk flexed Gait velocity: decreased Gait velocity interpretation: 1.31 - 2.62 ft/sec, indicative of limited community ambulator General Gait Details: Assist for safety   Stairs             Wheelchair Mobility    Modified Rankin (Stroke Patients Only)       Balance Overall balance assessment: Needs assistance Sitting-balance support: No upper extremity supported Sitting balance-Leahy Scale: Good     Standing balance support: No upper extremity supported;During functional activity Standing  balance-Leahy Scale: Fair                              Cognition Arousal/Alertness: Awake/alert Behavior During Therapy: WFL for tasks assessed/performed Overall Cognitive Status: No family/caregiver present to determine baseline cognitive functioning                                        Exercises      General Comments        Pertinent Vitals/Pain Pain Assessment: No/denies pain Faces Pain Scale: No hurt    Home Living                      Prior Function            PT Goals (current goals can now be found in the care plan section) Progress towards PT goals: Progressing toward goals    Frequency    Min 3X/week      PT Plan Current plan remains appropriate    Co-evaluation              AM-PAC PT "6 Clicks" Mobility   Outcome Measure  Help needed turning from your back to your side while in a flat bed without using bedrails?: None Help needed moving from lying on your back to sitting on the side of a flat bed without using bedrails?: None Help needed moving to and from a bed to a chair (including a wheelchair)?: A Little Help needed standing up from a chair using your arms (  e.g., wheelchair or bedside chair)?: A Little Help needed to walk in hospital room?: A Little Help needed climbing 3-5 steps with a railing? : A Little 6 Click Score: 20    End of Session   Activity Tolerance: Patient tolerated treatment well Patient left: in chair;with call bell/phone within reach;with chair alarm set Nurse Communication: Mobility status PT Visit Diagnosis: Unsteadiness on feet (R26.81);Difficulty in walking, not elsewhere classified (R26.2);Other symptoms and signs involving the nervous system (R29.898)     Time: 1191-47821515-1526 PT Time Calculation (min) (ACUTE ONLY): 11 min  Charges:  $Gait Training: 8-22 mins                     [facility] PT [facility] Pager [phone] Office  [phone]    Angelina OkCary W [facility] 10/06/2018, 3:56 PM

## 2018-10-06 NOTE — Care Management (Signed)
1648 10-06-18 Tomi Bamberger, RN,BSN [phone] CM placed new [employer] list for [facility] in patients room. Daughter will not be available to provide choice until tomorrow. Daughter to call Case Manager in am for agency of choice. Patient will transition home without services today. Will make referral in am. No further needs from CM at this time.

## 2018-10-07 LAB — VITAMIN B1: Vitamin B1 (Thiamine): 94.6 nmol/L (ref 66.5–200.0)

## 2018-10-08 NOTE — Care Management (Signed)
7588 10-08-18 Tomi Bamberger, RN,BSN Case Manager [phone] Daughter Lanora Manis never called Case Manager back to state which agency of choice she wanted for her mother's [facility]. CM did call daughter and voicemail was left, however no return call back. Patient will need to follow up with PCP for [facility]. No further needs from this Case Manager.

## 2019-06-07 IMAGING — DX DG CHEST 1V PORT
1 series · 1 of 1 positions shown · non-contrast
Comparison: CT chest 05/23/2018. Chest x-ray 05/22/2018,
01/02/2018..

CLINICAL DATA: Shortness of breath.

EXAM:
PORTABLE CHEST 1 VIEW

[chest ap]
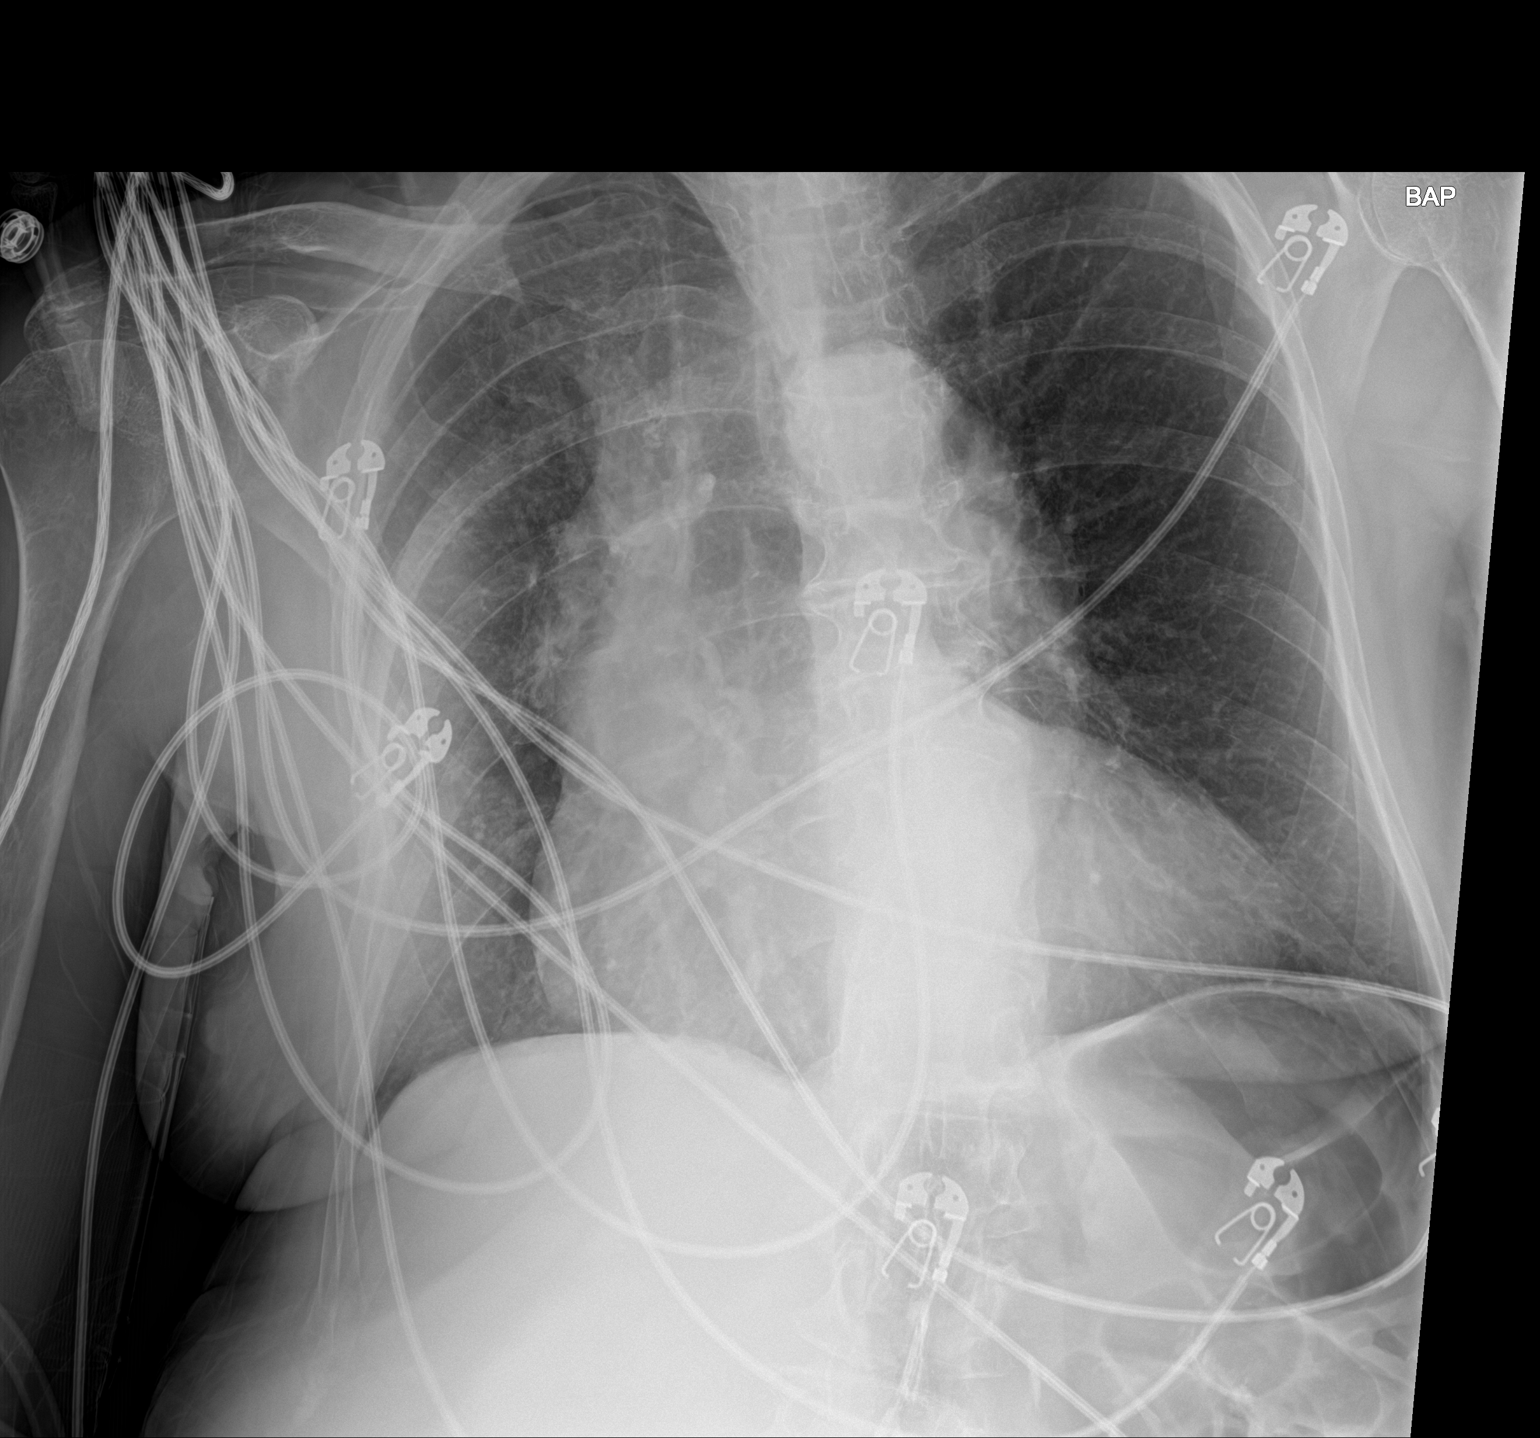

[1 of 1 positions shown; findings below may reference images not displayed]

FINDINGS: Patient rotated to the right. Stable aortic tortuosity noted. Stable
cardiomegaly. No pulmonary venous congestion. No focal infiltrate.
No pleural effusion or pneumothorax. Degenerative change thoracic
spine.
IMPRESSION: 1. Stable thoracic aortic tortuosity and cardiomegaly. No pulmonary
venous congestion.

2.  No acute pulmonary disease.

## 2020-08-23 IMAGING — CT CT HEAD W/O CM
4 series · 16 of 47 positions shown, 18 images · non-contrast
Comparison: Brain CT 12/06/2017

CLINICAL DATA: Patient with weakness.

EXAM:
CT HEAD WITHOUT CONTRAST
TECHNIQUE: Contiguous axial images were obtained from the base of the skull
through the vertex without intravenous contrast.

[Series 3: head wo · axial · 0.41mm/px · z∈[-231,-111]mm · 7 of 33 slices shown, 9 images]
[im 5/33  brain]
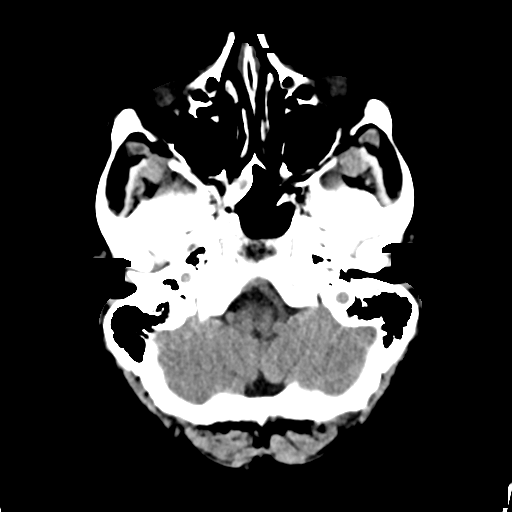
[im 5/33  bone]
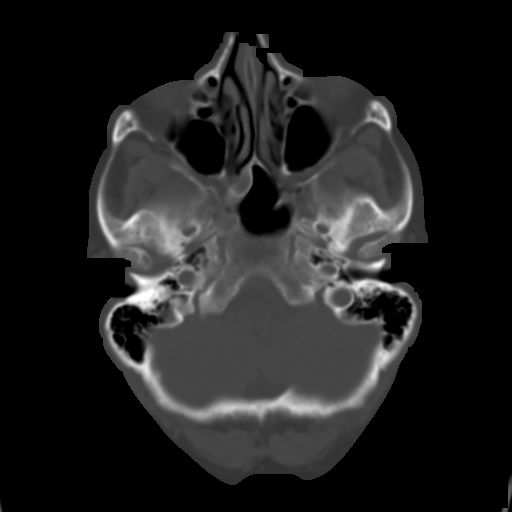
[im 9/33  brain]
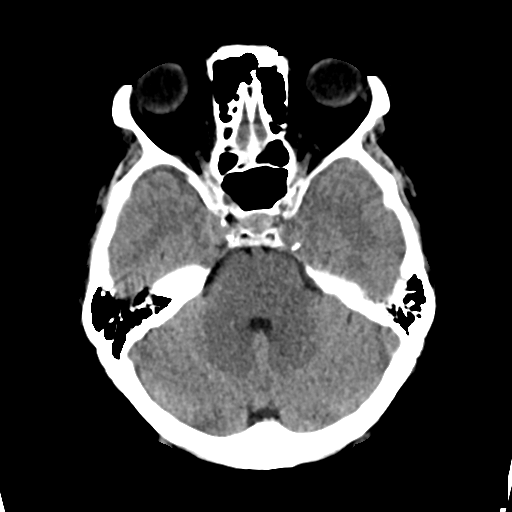
[im 13/33  brain]
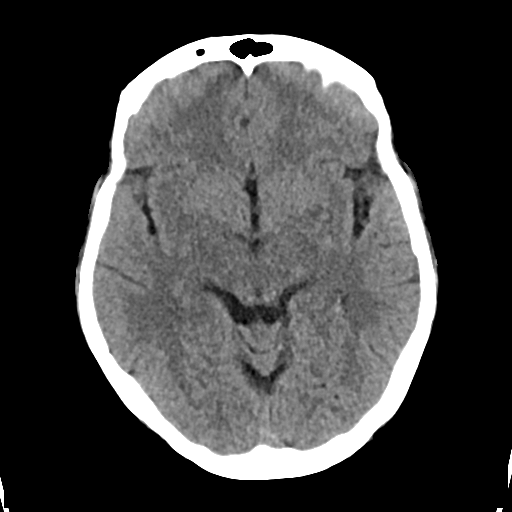
[im 17/33  brain]
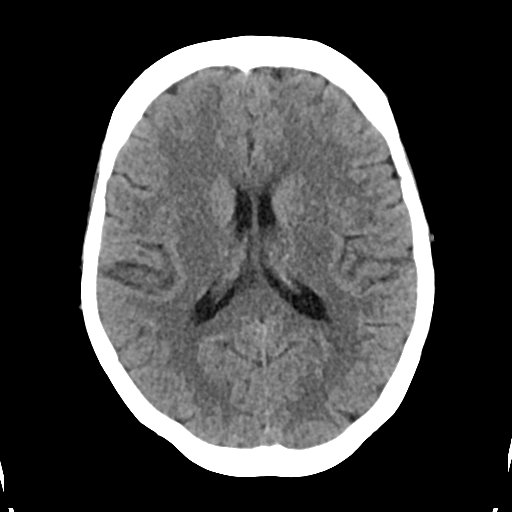
[im 21/33  brain]
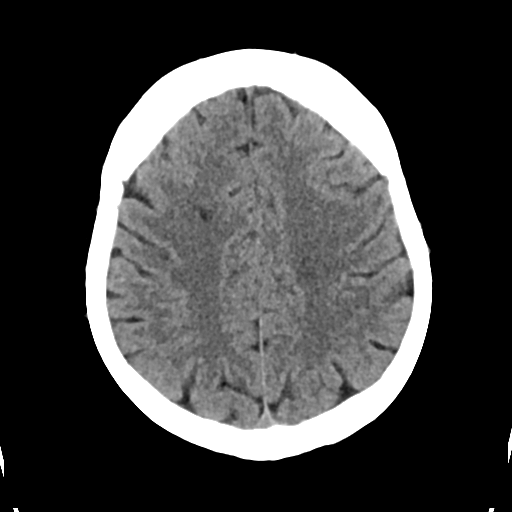
[im 21/33  bone]
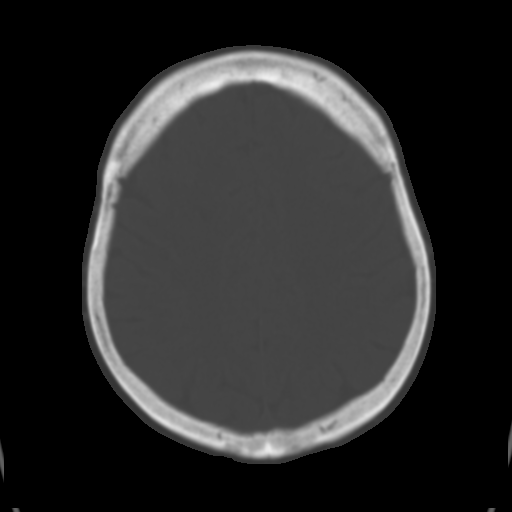
[im 25/33  brain]
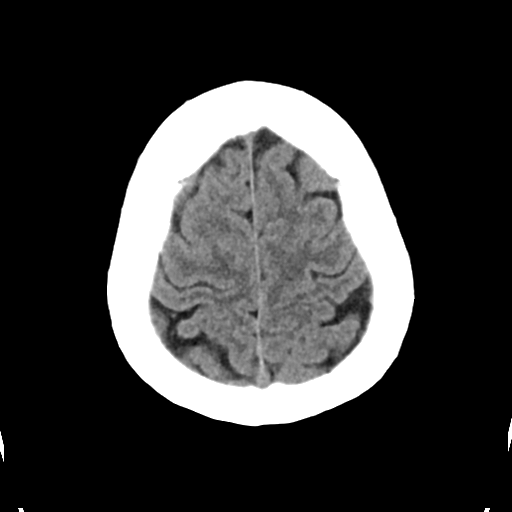
[im 29/33  brain]
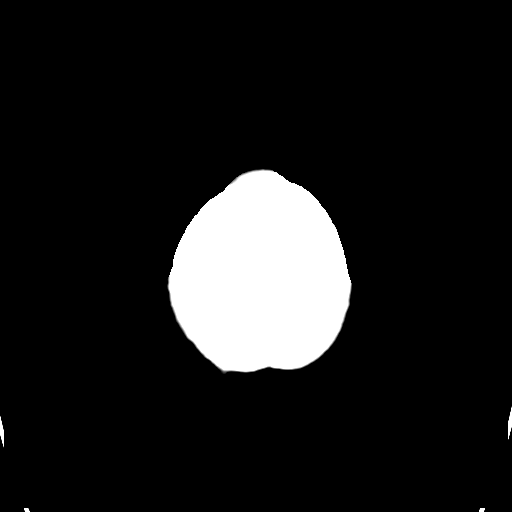

[Series 4: head bone · axial · 0.41mm/px · z∈[-235,-203]mm · 3 of 81 slices shown]
[im 9/81  bone]
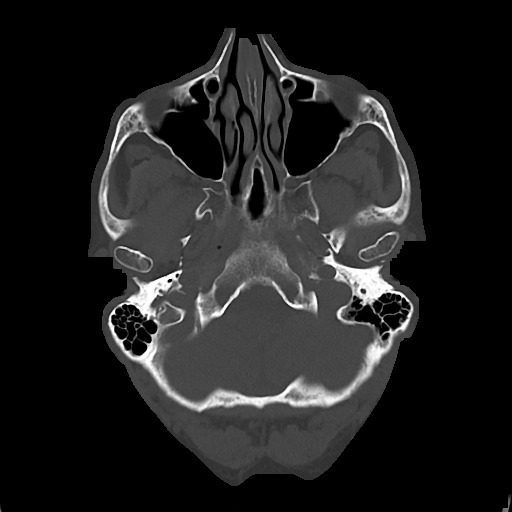
[im 17/81  bone]
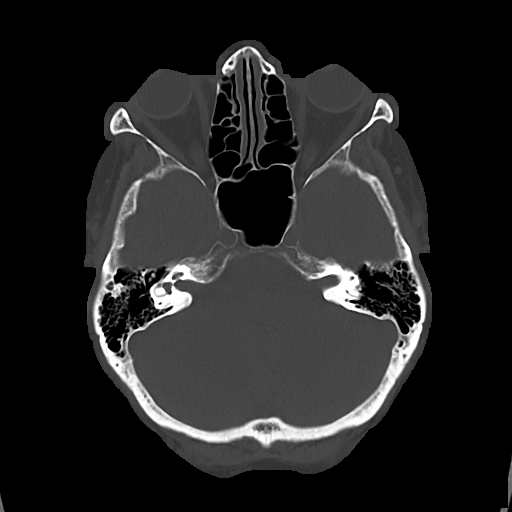
[im 25/81  bone]
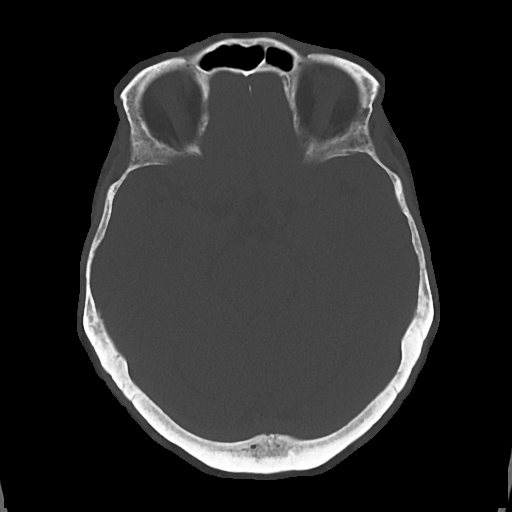

[Series 5: cor soft · coronal · 0.31mm/px · 3 of 67 slices shown]
[im 23/67  brain]
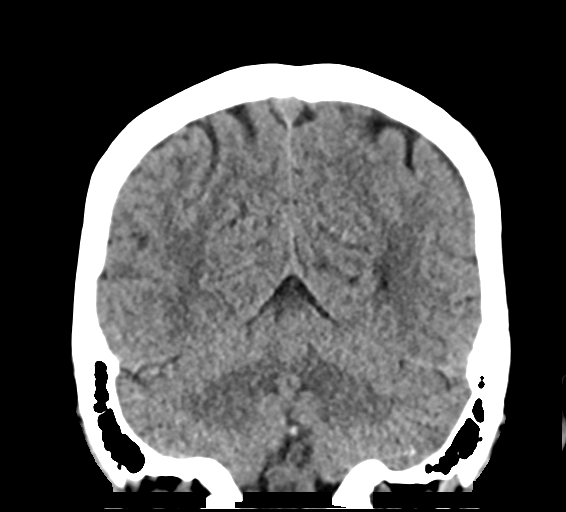
[im 30/67  brain]
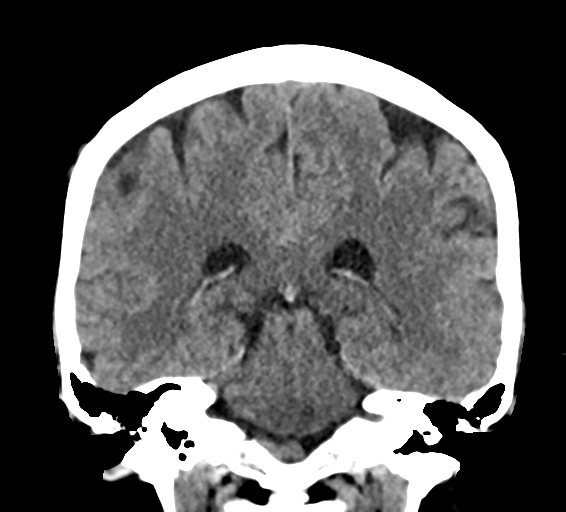
[im 37/67  brain]
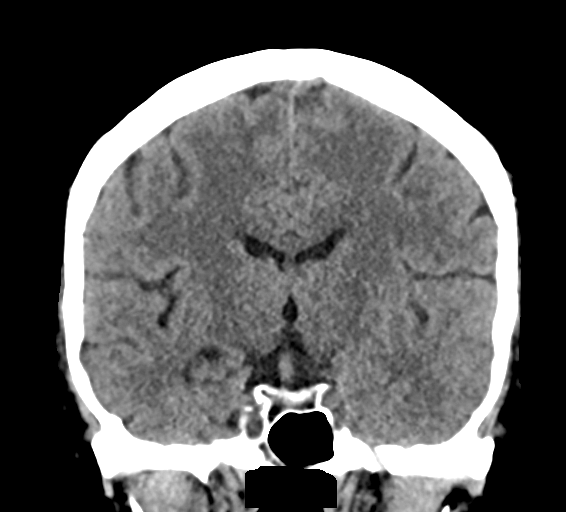

[Series 6: sag soft · sagittal · 0.31mm/px · 3 of 55 slices shown]
[im 19/55  brain]
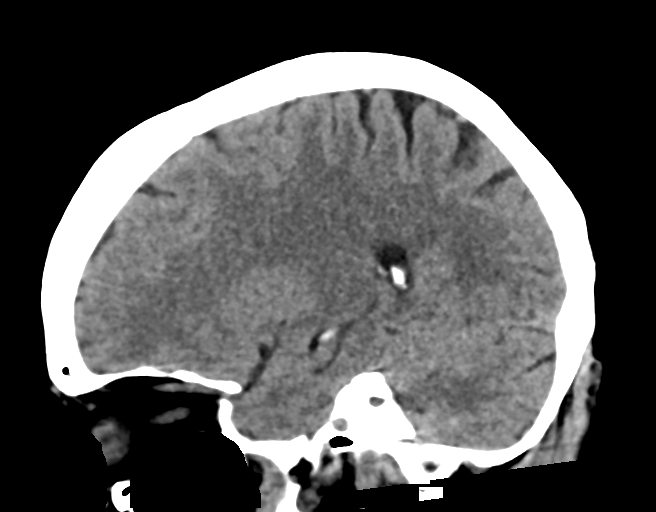
[im 28/55  brain]
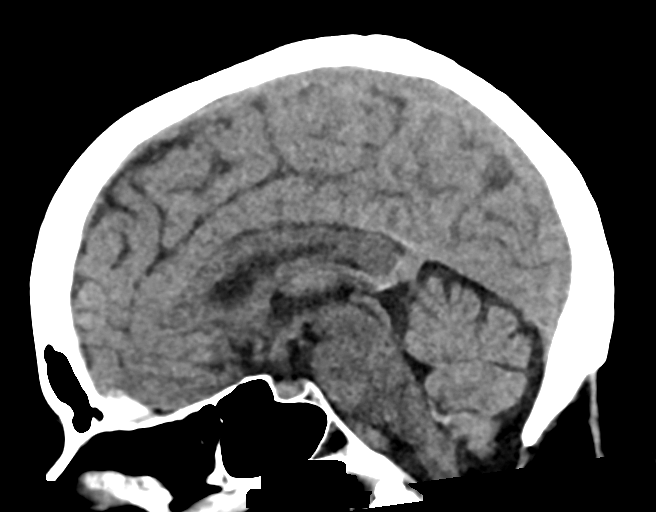
[im 37/55  brain]
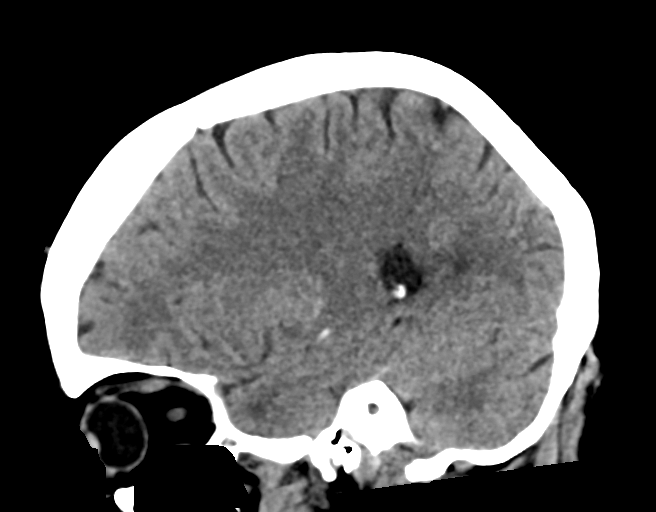

[16 of 47 positions shown; findings below may reference images not displayed]

FINDINGS: Brain: Ventricles and sulci are appropriate for patient's age. No
evidence for acute cortically based infarct, intracranial
hemorrhage, mass lesion or mass-effect. Periventricular and
subcortical white matter hypodensity compatible with chronic
microvascular ischemic changes.

Vascular: Internal carotid arterial vascular calcifications.

Skull: Intact.

Sinuses/Orbits: Paranasal sinuses are well aerated. Mastoid air
cells unremarkable. Orbits are unremarkable.

Other: None.
IMPRESSION: No acute intracranial process.

Atrophy and chronic microvascular ischemic changes.

## 2021-04-12 DIAGNOSIS — K255 Chronic or unspecified gastric ulcer with perforation: Secondary | ICD-10-CM | POA: Diagnosis not present

## 2021-04-12 DIAGNOSIS — K4090 Unilateral inguinal hernia, without obstruction or gangrene, not specified as recurrent: Secondary | ICD-10-CM | POA: Diagnosis not present

## 2021-04-12 DIAGNOSIS — R61 Generalized hyperhidrosis: Secondary | ICD-10-CM | POA: Diagnosis not present

## 2021-04-12 DIAGNOSIS — J449 Chronic obstructive pulmonary disease, unspecified: Secondary | ICD-10-CM | POA: Diagnosis not present

## 2021-04-12 DIAGNOSIS — E43 Unspecified severe protein-calorie malnutrition: Secondary | ICD-10-CM | POA: Diagnosis not present

## 2021-04-12 DIAGNOSIS — K4030 Unilateral inguinal hernia, with obstruction, without gangrene, not specified as recurrent: Secondary | ICD-10-CM | POA: Diagnosis not present

## 2021-04-12 DIAGNOSIS — A419 Sepsis, unspecified organism: Secondary | ICD-10-CM | POA: Diagnosis not present

## 2021-04-12 DIAGNOSIS — I712 Thoracic aortic aneurysm, without rupture: Secondary | ICD-10-CM | POA: Diagnosis not present

## 2021-04-12 DIAGNOSIS — K660 Peritoneal adhesions (postprocedural) (postinfection): Secondary | ICD-10-CM | POA: Diagnosis not present

## 2021-04-12 DIAGNOSIS — T83518A Infection and inflammatory reaction due to other urinary catheter, initial encounter: Secondary | ICD-10-CM | POA: Diagnosis not present

## 2021-04-12 DIAGNOSIS — Z720 Tobacco use: Secondary | ICD-10-CM | POA: Diagnosis not present

## 2021-04-12 DIAGNOSIS — N189 Chronic kidney disease, unspecified: Secondary | ICD-10-CM | POA: Diagnosis not present

## 2021-04-12 DIAGNOSIS — R69 Illness, unspecified: Secondary | ICD-10-CM | POA: Diagnosis not present

## 2021-04-12 DIAGNOSIS — S3639XA Other injury of stomach, initial encounter: Secondary | ICD-10-CM | POA: Diagnosis not present

## 2021-04-12 DIAGNOSIS — R410 Disorientation, unspecified: Secondary | ICD-10-CM | POA: Diagnosis not present

## 2021-04-13 DIAGNOSIS — J439 Emphysema, unspecified: Secondary | ICD-10-CM | POA: Diagnosis not present

## 2021-04-13 DIAGNOSIS — J811 Chronic pulmonary edema: Secondary | ICD-10-CM | POA: Diagnosis not present

## 2021-04-13 DIAGNOSIS — I712 Thoracic aortic aneurysm, without rupture: Secondary | ICD-10-CM | POA: Diagnosis not present

## 2021-04-13 DIAGNOSIS — Z48813 Encounter for surgical aftercare following surgery on the respiratory system: Secondary | ICD-10-CM | POA: Diagnosis not present

## 2021-04-13 DIAGNOSIS — N390 Urinary tract infection, site not specified: Secondary | ICD-10-CM | POA: Diagnosis not present

## 2021-04-13 DIAGNOSIS — J9620 Acute and chronic respiratory failure, unspecified whether with hypoxia or hypercapnia: Secondary | ICD-10-CM | POA: Diagnosis not present

## 2021-04-13 DIAGNOSIS — J189 Pneumonia, unspecified organism: Secondary | ICD-10-CM | POA: Diagnosis not present

## 2021-04-13 DIAGNOSIS — R0902 Hypoxemia: Secondary | ICD-10-CM | POA: Diagnosis not present

## 2021-04-13 DIAGNOSIS — J90 Pleural effusion, not elsewhere classified: Secondary | ICD-10-CM | POA: Diagnosis not present

## 2021-04-13 DIAGNOSIS — R918 Other nonspecific abnormal finding of lung field: Secondary | ICD-10-CM | POA: Diagnosis not present

## 2021-04-13 DIAGNOSIS — K4030 Unilateral inguinal hernia, with obstruction, without gangrene, not specified as recurrent: Secondary | ICD-10-CM | POA: Diagnosis not present

## 2021-04-13 DIAGNOSIS — Y92230 Patient room in hospital as the place of occurrence of the external cause: Secondary | ICD-10-CM | POA: Diagnosis not present

## 2021-04-13 DIAGNOSIS — R846 Abnormal cytological findings in specimens from respiratory organs and thorax: Secondary | ICD-10-CM | POA: Diagnosis not present

## 2021-04-13 DIAGNOSIS — I517 Cardiomegaly: Secondary | ICD-10-CM | POA: Diagnosis not present

## 2021-04-13 DIAGNOSIS — K4090 Unilateral inguinal hernia, without obstruction or gangrene, not specified as recurrent: Secondary | ICD-10-CM | POA: Diagnosis not present

## 2021-04-13 DIAGNOSIS — I509 Heart failure, unspecified: Secondary | ICD-10-CM | POA: Diagnosis not present

## 2021-04-13 DIAGNOSIS — R61 Generalized hyperhidrosis: Secondary | ICD-10-CM | POA: Diagnosis not present

## 2021-04-13 DIAGNOSIS — R188 Other ascites: Secondary | ICD-10-CM | POA: Diagnosis not present

## 2021-04-13 DIAGNOSIS — K660 Peritoneal adhesions (postprocedural) (postinfection): Secondary | ICD-10-CM | POA: Diagnosis not present

## 2021-04-13 DIAGNOSIS — A419 Sepsis, unspecified organism: Secondary | ICD-10-CM | POA: Diagnosis not present

## 2021-04-13 DIAGNOSIS — R410 Disorientation, unspecified: Secondary | ICD-10-CM | POA: Diagnosis not present

## 2021-04-13 DIAGNOSIS — R194 Change in bowel habit: Secondary | ICD-10-CM | POA: Diagnosis not present

## 2021-04-13 DIAGNOSIS — Z9981 Dependence on supplemental oxygen: Secondary | ICD-10-CM | POA: Diagnosis not present

## 2021-04-13 DIAGNOSIS — K255 Chronic or unspecified gastric ulcer with perforation: Secondary | ICD-10-CM | POA: Diagnosis not present

## 2021-04-13 DIAGNOSIS — J690 Pneumonitis due to inhalation of food and vomit: Secondary | ICD-10-CM | POA: Diagnosis not present

## 2021-04-13 DIAGNOSIS — Z720 Tobacco use: Secondary | ICD-10-CM | POA: Diagnosis not present

## 2021-04-13 DIAGNOSIS — K259 Gastric ulcer, unspecified as acute or chronic, without hemorrhage or perforation: Secondary | ICD-10-CM | POA: Diagnosis not present

## 2021-04-13 DIAGNOSIS — J9601 Acute respiratory failure with hypoxia: Secondary | ICD-10-CM | POA: Diagnosis not present

## 2021-04-13 DIAGNOSIS — I10 Essential (primary) hypertension: Secondary | ICD-10-CM | POA: Diagnosis not present

## 2021-04-13 DIAGNOSIS — E43 Unspecified severe protein-calorie malnutrition: Secondary | ICD-10-CM | POA: Diagnosis not present

## 2021-04-13 DIAGNOSIS — J9811 Atelectasis: Secondary | ICD-10-CM | POA: Diagnosis not present

## 2021-04-13 DIAGNOSIS — K31A19 Gastric intestinal metaplasia without dysplasia, unspecified site: Secondary | ICD-10-CM | POA: Diagnosis not present

## 2021-04-13 DIAGNOSIS — R109 Unspecified abdominal pain: Secondary | ICD-10-CM | POA: Diagnosis not present

## 2021-04-13 DIAGNOSIS — J9819 Other pulmonary collapse: Secondary | ICD-10-CM | POA: Diagnosis not present

## 2021-04-13 DIAGNOSIS — I700 Atherosclerosis of aorta: Secondary | ICD-10-CM | POA: Diagnosis not present

## 2021-04-13 DIAGNOSIS — K668 Other specified disorders of peritoneum: Secondary | ICD-10-CM | POA: Diagnosis not present

## 2021-04-13 DIAGNOSIS — T83518A Infection and inflammatory reaction due to other urinary catheter, initial encounter: Secondary | ICD-10-CM | POA: Diagnosis not present

## 2021-04-13 DIAGNOSIS — R0600 Dyspnea, unspecified: Secondary | ICD-10-CM | POA: Diagnosis not present

## 2021-04-13 DIAGNOSIS — Z9911 Dependence on respirator [ventilator] status: Secondary | ICD-10-CM | POA: Diagnosis not present

## 2021-04-13 DIAGNOSIS — T17500A Unspecified foreign body in bronchus causing asphyxiation, initial encounter: Secondary | ICD-10-CM | POA: Diagnosis not present

## 2021-04-13 DIAGNOSIS — Z9989 Dependence on other enabling machines and devices: Secondary | ICD-10-CM | POA: Diagnosis not present

## 2021-04-13 DIAGNOSIS — Y846 Urinary catheterization as the cause of abnormal reaction of the patient, or of later complication, without mention of misadventure at the time of the procedure: Secondary | ICD-10-CM | POA: Diagnosis not present

## 2021-04-13 DIAGNOSIS — J9690 Respiratory failure, unspecified, unspecified whether with hypoxia or hypercapnia: Secondary | ICD-10-CM | POA: Diagnosis not present

## 2021-04-13 DIAGNOSIS — J984 Other disorders of lung: Secondary | ICD-10-CM | POA: Diagnosis not present

## 2021-04-13 DIAGNOSIS — K2950 Unspecified chronic gastritis without bleeding: Secondary | ICD-10-CM | POA: Diagnosis not present

## 2021-04-13 DIAGNOSIS — Y838 Other surgical procedures as the cause of abnormal reaction of the patient, or of later complication, without mention of misadventure at the time of the procedure: Secondary | ICD-10-CM | POA: Diagnosis not present

## 2021-04-14 DIAGNOSIS — I361 Nonrheumatic tricuspid (valve) insufficiency: Secondary | ICD-10-CM

## 2021-04-14 DIAGNOSIS — R918 Other nonspecific abnormal finding of lung field: Secondary | ICD-10-CM | POA: Diagnosis not present

## 2021-04-14 DIAGNOSIS — A419 Sepsis, unspecified organism: Secondary | ICD-10-CM | POA: Diagnosis not present

## 2021-04-14 DIAGNOSIS — E43 Unspecified severe protein-calorie malnutrition: Secondary | ICD-10-CM | POA: Diagnosis not present

## 2021-04-14 DIAGNOSIS — T83518A Infection and inflammatory reaction due to other urinary catheter, initial encounter: Secondary | ICD-10-CM | POA: Diagnosis not present

## 2021-04-15 DIAGNOSIS — E43 Unspecified severe protein-calorie malnutrition: Secondary | ICD-10-CM | POA: Diagnosis not present

## 2021-04-15 DIAGNOSIS — A419 Sepsis, unspecified organism: Secondary | ICD-10-CM | POA: Diagnosis not present

## 2021-04-15 DIAGNOSIS — T83518A Infection and inflammatory reaction due to other urinary catheter, initial encounter: Secondary | ICD-10-CM | POA: Diagnosis not present

## 2021-04-16 DIAGNOSIS — I517 Cardiomegaly: Secondary | ICD-10-CM | POA: Diagnosis not present

## 2021-04-16 DIAGNOSIS — E43 Unspecified severe protein-calorie malnutrition: Secondary | ICD-10-CM | POA: Diagnosis not present

## 2021-04-16 DIAGNOSIS — R0600 Dyspnea, unspecified: Secondary | ICD-10-CM | POA: Diagnosis not present

## 2021-04-16 DIAGNOSIS — A419 Sepsis, unspecified organism: Secondary | ICD-10-CM | POA: Diagnosis not present

## 2021-04-16 DIAGNOSIS — J189 Pneumonia, unspecified organism: Secondary | ICD-10-CM | POA: Diagnosis not present

## 2021-04-16 DIAGNOSIS — T83518A Infection and inflammatory reaction due to other urinary catheter, initial encounter: Secondary | ICD-10-CM | POA: Diagnosis not present

## 2021-04-17 DIAGNOSIS — J984 Other disorders of lung: Secondary | ICD-10-CM | POA: Diagnosis not present

## 2021-04-17 DIAGNOSIS — E43 Unspecified severe protein-calorie malnutrition: Secondary | ICD-10-CM | POA: Diagnosis not present

## 2021-04-17 DIAGNOSIS — R918 Other nonspecific abnormal finding of lung field: Secondary | ICD-10-CM | POA: Diagnosis not present

## 2021-04-17 DIAGNOSIS — T83518A Infection and inflammatory reaction due to other urinary catheter, initial encounter: Secondary | ICD-10-CM | POA: Diagnosis not present

## 2021-04-17 DIAGNOSIS — A419 Sepsis, unspecified organism: Secondary | ICD-10-CM | POA: Diagnosis not present

## 2021-04-18 DIAGNOSIS — T83518A Infection and inflammatory reaction due to other urinary catheter, initial encounter: Secondary | ICD-10-CM | POA: Diagnosis not present

## 2021-04-18 DIAGNOSIS — R918 Other nonspecific abnormal finding of lung field: Secondary | ICD-10-CM | POA: Diagnosis not present

## 2021-04-18 DIAGNOSIS — T17500A Unspecified foreign body in bronchus causing asphyxiation, initial encounter: Secondary | ICD-10-CM | POA: Diagnosis not present

## 2021-04-18 DIAGNOSIS — J9620 Acute and chronic respiratory failure, unspecified whether with hypoxia or hypercapnia: Secondary | ICD-10-CM | POA: Diagnosis not present

## 2021-04-18 DIAGNOSIS — J9811 Atelectasis: Secondary | ICD-10-CM | POA: Diagnosis not present

## 2021-04-18 DIAGNOSIS — J90 Pleural effusion, not elsewhere classified: Secondary | ICD-10-CM | POA: Diagnosis not present

## 2021-04-18 DIAGNOSIS — I10 Essential (primary) hypertension: Secondary | ICD-10-CM | POA: Diagnosis not present

## 2021-04-18 DIAGNOSIS — K668 Other specified disorders of peritoneum: Secondary | ICD-10-CM | POA: Diagnosis not present

## 2021-04-18 DIAGNOSIS — A419 Sepsis, unspecified organism: Secondary | ICD-10-CM | POA: Diagnosis not present

## 2021-04-18 DIAGNOSIS — J9819 Other pulmonary collapse: Secondary | ICD-10-CM | POA: Diagnosis not present

## 2021-04-18 DIAGNOSIS — Z720 Tobacco use: Secondary | ICD-10-CM | POA: Diagnosis not present

## 2021-04-18 DIAGNOSIS — E43 Unspecified severe protein-calorie malnutrition: Secondary | ICD-10-CM | POA: Diagnosis not present

## 2021-04-18 DIAGNOSIS — Z9911 Dependence on respirator [ventilator] status: Secondary | ICD-10-CM | POA: Diagnosis not present

## 2021-04-18 DIAGNOSIS — J189 Pneumonia, unspecified organism: Secondary | ICD-10-CM | POA: Diagnosis not present

## 2021-04-18 DIAGNOSIS — I509 Heart failure, unspecified: Secondary | ICD-10-CM | POA: Diagnosis not present

## 2021-04-19 DIAGNOSIS — T83518A Infection and inflammatory reaction due to other urinary catheter, initial encounter: Secondary | ICD-10-CM | POA: Diagnosis not present

## 2021-04-19 DIAGNOSIS — E43 Unspecified severe protein-calorie malnutrition: Secondary | ICD-10-CM | POA: Diagnosis not present

## 2021-04-19 DIAGNOSIS — R194 Change in bowel habit: Secondary | ICD-10-CM | POA: Diagnosis not present

## 2021-04-19 DIAGNOSIS — J90 Pleural effusion, not elsewhere classified: Secondary | ICD-10-CM | POA: Diagnosis not present

## 2021-04-19 DIAGNOSIS — T17500A Unspecified foreign body in bronchus causing asphyxiation, initial encounter: Secondary | ICD-10-CM | POA: Diagnosis not present

## 2021-04-19 DIAGNOSIS — Z9911 Dependence on respirator [ventilator] status: Secondary | ICD-10-CM | POA: Diagnosis not present

## 2021-04-19 DIAGNOSIS — Z720 Tobacco use: Secondary | ICD-10-CM | POA: Diagnosis not present

## 2021-04-19 DIAGNOSIS — A419 Sepsis, unspecified organism: Secondary | ICD-10-CM | POA: Diagnosis not present

## 2021-04-19 DIAGNOSIS — R918 Other nonspecific abnormal finding of lung field: Secondary | ICD-10-CM | POA: Diagnosis not present

## 2021-04-19 DIAGNOSIS — K668 Other specified disorders of peritoneum: Secondary | ICD-10-CM | POA: Diagnosis not present

## 2021-04-19 DIAGNOSIS — K631 Perforation of intestine (nontraumatic): Secondary | ICD-10-CM

## 2021-04-19 DIAGNOSIS — J9690 Respiratory failure, unspecified, unspecified whether with hypoxia or hypercapnia: Secondary | ICD-10-CM | POA: Diagnosis not present

## 2021-04-19 DIAGNOSIS — J9620 Acute and chronic respiratory failure, unspecified whether with hypoxia or hypercapnia: Secondary | ICD-10-CM | POA: Diagnosis not present

## 2021-04-20 DIAGNOSIS — J9620 Acute and chronic respiratory failure, unspecified whether with hypoxia or hypercapnia: Secondary | ICD-10-CM | POA: Diagnosis not present

## 2021-04-20 DIAGNOSIS — Z9911 Dependence on respirator [ventilator] status: Secondary | ICD-10-CM | POA: Diagnosis not present

## 2021-04-20 DIAGNOSIS — E43 Unspecified severe protein-calorie malnutrition: Secondary | ICD-10-CM | POA: Diagnosis not present

## 2021-04-20 DIAGNOSIS — R918 Other nonspecific abnormal finding of lung field: Secondary | ICD-10-CM | POA: Diagnosis not present

## 2021-04-20 DIAGNOSIS — K668 Other specified disorders of peritoneum: Secondary | ICD-10-CM | POA: Diagnosis not present

## 2021-04-20 DIAGNOSIS — I517 Cardiomegaly: Secondary | ICD-10-CM | POA: Diagnosis not present

## 2021-04-20 DIAGNOSIS — J9690 Respiratory failure, unspecified, unspecified whether with hypoxia or hypercapnia: Secondary | ICD-10-CM | POA: Diagnosis not present

## 2021-04-20 DIAGNOSIS — J90 Pleural effusion, not elsewhere classified: Secondary | ICD-10-CM | POA: Diagnosis not present

## 2021-04-20 DIAGNOSIS — Z720 Tobacco use: Secondary | ICD-10-CM | POA: Diagnosis not present

## 2021-04-20 DIAGNOSIS — T83518A Infection and inflammatory reaction due to other urinary catheter, initial encounter: Secondary | ICD-10-CM | POA: Diagnosis not present

## 2021-04-20 DIAGNOSIS — A419 Sepsis, unspecified organism: Secondary | ICD-10-CM | POA: Diagnosis not present

## 2021-04-20 DIAGNOSIS — T17500A Unspecified foreign body in bronchus causing asphyxiation, initial encounter: Secondary | ICD-10-CM | POA: Diagnosis not present

## 2021-04-21 DIAGNOSIS — J9690 Respiratory failure, unspecified, unspecified whether with hypoxia or hypercapnia: Secondary | ICD-10-CM | POA: Diagnosis not present

## 2021-04-21 DIAGNOSIS — I517 Cardiomegaly: Secondary | ICD-10-CM | POA: Diagnosis not present

## 2021-04-21 DIAGNOSIS — J90 Pleural effusion, not elsewhere classified: Secondary | ICD-10-CM | POA: Diagnosis not present

## 2021-04-21 DIAGNOSIS — A419 Sepsis, unspecified organism: Secondary | ICD-10-CM | POA: Diagnosis not present

## 2021-04-21 DIAGNOSIS — T83518A Infection and inflammatory reaction due to other urinary catheter, initial encounter: Secondary | ICD-10-CM | POA: Diagnosis not present

## 2021-04-21 DIAGNOSIS — E43 Unspecified severe protein-calorie malnutrition: Secondary | ICD-10-CM | POA: Diagnosis not present

## 2021-04-21 DIAGNOSIS — I509 Heart failure, unspecified: Secondary | ICD-10-CM | POA: Diagnosis not present

## 2021-04-22 DIAGNOSIS — E43 Unspecified severe protein-calorie malnutrition: Secondary | ICD-10-CM | POA: Diagnosis not present

## 2021-04-22 DIAGNOSIS — A419 Sepsis, unspecified organism: Secondary | ICD-10-CM | POA: Diagnosis not present

## 2021-04-22 DIAGNOSIS — T83518A Infection and inflammatory reaction due to other urinary catheter, initial encounter: Secondary | ICD-10-CM | POA: Diagnosis not present

## 2021-04-23 DIAGNOSIS — T83518A Infection and inflammatory reaction due to other urinary catheter, initial encounter: Secondary | ICD-10-CM | POA: Diagnosis not present

## 2021-04-23 DIAGNOSIS — A419 Sepsis, unspecified organism: Secondary | ICD-10-CM | POA: Diagnosis not present

## 2021-04-23 DIAGNOSIS — J9620 Acute and chronic respiratory failure, unspecified whether with hypoxia or hypercapnia: Secondary | ICD-10-CM | POA: Diagnosis not present

## 2021-04-23 DIAGNOSIS — T17500A Unspecified foreign body in bronchus causing asphyxiation, initial encounter: Secondary | ICD-10-CM | POA: Diagnosis not present

## 2021-04-23 DIAGNOSIS — K668 Other specified disorders of peritoneum: Secondary | ICD-10-CM | POA: Diagnosis not present

## 2021-04-23 DIAGNOSIS — Z720 Tobacco use: Secondary | ICD-10-CM | POA: Diagnosis not present

## 2021-04-23 DIAGNOSIS — R918 Other nonspecific abnormal finding of lung field: Secondary | ICD-10-CM | POA: Diagnosis not present

## 2021-04-23 DIAGNOSIS — J90 Pleural effusion, not elsewhere classified: Secondary | ICD-10-CM | POA: Diagnosis not present

## 2021-04-23 DIAGNOSIS — E43 Unspecified severe protein-calorie malnutrition: Secondary | ICD-10-CM | POA: Diagnosis not present

## 2021-04-23 DIAGNOSIS — Z9911 Dependence on respirator [ventilator] status: Secondary | ICD-10-CM | POA: Diagnosis not present

## 2021-04-24 DIAGNOSIS — E43 Unspecified severe protein-calorie malnutrition: Secondary | ICD-10-CM | POA: Diagnosis not present

## 2021-04-24 DIAGNOSIS — J90 Pleural effusion, not elsewhere classified: Secondary | ICD-10-CM | POA: Diagnosis not present

## 2021-04-24 DIAGNOSIS — I517 Cardiomegaly: Secondary | ICD-10-CM | POA: Diagnosis not present

## 2021-04-24 DIAGNOSIS — T83518A Infection and inflammatory reaction due to other urinary catheter, initial encounter: Secondary | ICD-10-CM | POA: Diagnosis not present

## 2021-04-24 DIAGNOSIS — A419 Sepsis, unspecified organism: Secondary | ICD-10-CM | POA: Diagnosis not present

## 2021-04-24 DIAGNOSIS — J9690 Respiratory failure, unspecified, unspecified whether with hypoxia or hypercapnia: Secondary | ICD-10-CM | POA: Diagnosis not present

## 2021-04-24 DIAGNOSIS — Z720 Tobacco use: Secondary | ICD-10-CM | POA: Diagnosis not present

## 2021-04-24 DIAGNOSIS — K668 Other specified disorders of peritoneum: Secondary | ICD-10-CM | POA: Diagnosis not present

## 2021-04-24 DIAGNOSIS — Z9911 Dependence on respirator [ventilator] status: Secondary | ICD-10-CM | POA: Diagnosis not present

## 2021-04-24 DIAGNOSIS — T17500A Unspecified foreign body in bronchus causing asphyxiation, initial encounter: Secondary | ICD-10-CM | POA: Diagnosis not present

## 2021-04-24 DIAGNOSIS — R918 Other nonspecific abnormal finding of lung field: Secondary | ICD-10-CM | POA: Diagnosis not present

## 2021-04-24 DIAGNOSIS — J811 Chronic pulmonary edema: Secondary | ICD-10-CM | POA: Diagnosis not present

## 2021-04-24 DIAGNOSIS — J9620 Acute and chronic respiratory failure, unspecified whether with hypoxia or hypercapnia: Secondary | ICD-10-CM | POA: Diagnosis not present

## 2021-04-25 DIAGNOSIS — J9620 Acute and chronic respiratory failure, unspecified whether with hypoxia or hypercapnia: Secondary | ICD-10-CM | POA: Diagnosis not present

## 2021-04-25 DIAGNOSIS — R918 Other nonspecific abnormal finding of lung field: Secondary | ICD-10-CM | POA: Diagnosis not present

## 2021-04-25 DIAGNOSIS — T17500A Unspecified foreign body in bronchus causing asphyxiation, initial encounter: Secondary | ICD-10-CM | POA: Diagnosis not present

## 2021-04-25 DIAGNOSIS — K668 Other specified disorders of peritoneum: Secondary | ICD-10-CM | POA: Diagnosis not present

## 2021-04-25 DIAGNOSIS — Z720 Tobacco use: Secondary | ICD-10-CM | POA: Diagnosis not present

## 2021-04-25 DIAGNOSIS — I509 Heart failure, unspecified: Secondary | ICD-10-CM | POA: Diagnosis not present

## 2021-04-25 DIAGNOSIS — A419 Sepsis, unspecified organism: Secondary | ICD-10-CM | POA: Diagnosis not present

## 2021-04-25 DIAGNOSIS — T83518A Infection and inflammatory reaction due to other urinary catheter, initial encounter: Secondary | ICD-10-CM | POA: Diagnosis not present

## 2021-04-25 DIAGNOSIS — Z9911 Dependence on respirator [ventilator] status: Secondary | ICD-10-CM | POA: Diagnosis not present

## 2021-04-25 DIAGNOSIS — J90 Pleural effusion, not elsewhere classified: Secondary | ICD-10-CM | POA: Diagnosis not present

## 2021-04-25 DIAGNOSIS — J9811 Atelectasis: Secondary | ICD-10-CM | POA: Diagnosis not present

## 2021-04-25 DIAGNOSIS — J9690 Respiratory failure, unspecified, unspecified whether with hypoxia or hypercapnia: Secondary | ICD-10-CM | POA: Diagnosis not present

## 2021-04-25 DIAGNOSIS — E43 Unspecified severe protein-calorie malnutrition: Secondary | ICD-10-CM | POA: Diagnosis not present

## 2021-04-25 DIAGNOSIS — I517 Cardiomegaly: Secondary | ICD-10-CM | POA: Diagnosis not present

## 2021-04-26 DIAGNOSIS — T17500A Unspecified foreign body in bronchus causing asphyxiation, initial encounter: Secondary | ICD-10-CM | POA: Diagnosis not present

## 2021-04-26 DIAGNOSIS — T83518A Infection and inflammatory reaction due to other urinary catheter, initial encounter: Secondary | ICD-10-CM | POA: Diagnosis not present

## 2021-04-26 DIAGNOSIS — J189 Pneumonia, unspecified organism: Secondary | ICD-10-CM | POA: Diagnosis not present

## 2021-04-26 DIAGNOSIS — E43 Unspecified severe protein-calorie malnutrition: Secondary | ICD-10-CM | POA: Diagnosis not present

## 2021-04-26 DIAGNOSIS — J90 Pleural effusion, not elsewhere classified: Secondary | ICD-10-CM | POA: Diagnosis not present

## 2021-04-26 DIAGNOSIS — Z720 Tobacco use: Secondary | ICD-10-CM | POA: Diagnosis not present

## 2021-04-26 DIAGNOSIS — Z9989 Dependence on other enabling machines and devices: Secondary | ICD-10-CM | POA: Diagnosis not present

## 2021-04-26 DIAGNOSIS — J9690 Respiratory failure, unspecified, unspecified whether with hypoxia or hypercapnia: Secondary | ICD-10-CM | POA: Diagnosis not present

## 2021-04-26 DIAGNOSIS — J9620 Acute and chronic respiratory failure, unspecified whether with hypoxia or hypercapnia: Secondary | ICD-10-CM | POA: Diagnosis not present

## 2021-04-26 DIAGNOSIS — Z9981 Dependence on supplemental oxygen: Secondary | ICD-10-CM | POA: Diagnosis not present

## 2021-04-26 DIAGNOSIS — R918 Other nonspecific abnormal finding of lung field: Secondary | ICD-10-CM | POA: Diagnosis not present

## 2021-04-26 DIAGNOSIS — A419 Sepsis, unspecified organism: Secondary | ICD-10-CM | POA: Diagnosis not present

## 2021-04-26 DIAGNOSIS — K668 Other specified disorders of peritoneum: Secondary | ICD-10-CM | POA: Diagnosis not present

## 2021-04-27 DIAGNOSIS — Z9981 Dependence on supplemental oxygen: Secondary | ICD-10-CM | POA: Diagnosis not present

## 2021-04-27 DIAGNOSIS — T17500A Unspecified foreign body in bronchus causing asphyxiation, initial encounter: Secondary | ICD-10-CM | POA: Diagnosis not present

## 2021-04-27 DIAGNOSIS — Z720 Tobacco use: Secondary | ICD-10-CM | POA: Diagnosis not present

## 2021-04-27 DIAGNOSIS — J9811 Atelectasis: Secondary | ICD-10-CM | POA: Diagnosis not present

## 2021-04-27 DIAGNOSIS — T83518A Infection and inflammatory reaction due to other urinary catheter, initial encounter: Secondary | ICD-10-CM | POA: Diagnosis not present

## 2021-04-27 DIAGNOSIS — K668 Other specified disorders of peritoneum: Secondary | ICD-10-CM | POA: Diagnosis not present

## 2021-04-27 DIAGNOSIS — E43 Unspecified severe protein-calorie malnutrition: Secondary | ICD-10-CM | POA: Diagnosis not present

## 2021-04-27 DIAGNOSIS — I700 Atherosclerosis of aorta: Secondary | ICD-10-CM | POA: Diagnosis not present

## 2021-04-27 DIAGNOSIS — J9620 Acute and chronic respiratory failure, unspecified whether with hypoxia or hypercapnia: Secondary | ICD-10-CM | POA: Diagnosis not present

## 2021-04-27 DIAGNOSIS — R918 Other nonspecific abnormal finding of lung field: Secondary | ICD-10-CM | POA: Diagnosis not present

## 2021-04-27 DIAGNOSIS — Z48813 Encounter for surgical aftercare following surgery on the respiratory system: Secondary | ICD-10-CM | POA: Diagnosis not present

## 2021-04-27 DIAGNOSIS — J189 Pneumonia, unspecified organism: Secondary | ICD-10-CM | POA: Diagnosis not present

## 2021-04-27 DIAGNOSIS — J90 Pleural effusion, not elsewhere classified: Secondary | ICD-10-CM | POA: Diagnosis not present

## 2021-04-27 DIAGNOSIS — Z9989 Dependence on other enabling machines and devices: Secondary | ICD-10-CM | POA: Diagnosis not present

## 2021-04-27 DIAGNOSIS — A419 Sepsis, unspecified organism: Secondary | ICD-10-CM | POA: Diagnosis not present

## 2021-04-28 DIAGNOSIS — Z48813 Encounter for surgical aftercare following surgery on the respiratory system: Secondary | ICD-10-CM | POA: Diagnosis not present

## 2021-04-28 DIAGNOSIS — T83518A Infection and inflammatory reaction due to other urinary catheter, initial encounter: Secondary | ICD-10-CM | POA: Diagnosis not present

## 2021-04-28 DIAGNOSIS — E43 Unspecified severe protein-calorie malnutrition: Secondary | ICD-10-CM | POA: Diagnosis not present

## 2021-04-28 DIAGNOSIS — R918 Other nonspecific abnormal finding of lung field: Secondary | ICD-10-CM | POA: Diagnosis not present

## 2021-04-28 DIAGNOSIS — J90 Pleural effusion, not elsewhere classified: Secondary | ICD-10-CM | POA: Diagnosis not present

## 2021-04-28 DIAGNOSIS — J189 Pneumonia, unspecified organism: Secondary | ICD-10-CM | POA: Diagnosis not present

## 2021-04-28 DIAGNOSIS — A419 Sepsis, unspecified organism: Secondary | ICD-10-CM | POA: Diagnosis not present

## 2021-04-29 DIAGNOSIS — T83518A Infection and inflammatory reaction due to other urinary catheter, initial encounter: Secondary | ICD-10-CM | POA: Diagnosis not present

## 2021-04-29 DIAGNOSIS — E43 Unspecified severe protein-calorie malnutrition: Secondary | ICD-10-CM | POA: Diagnosis not present

## 2021-04-29 DIAGNOSIS — A419 Sepsis, unspecified organism: Secondary | ICD-10-CM | POA: Diagnosis not present

## 2021-04-30 DIAGNOSIS — I517 Cardiomegaly: Secondary | ICD-10-CM | POA: Diagnosis not present

## 2021-04-30 DIAGNOSIS — Z720 Tobacco use: Secondary | ICD-10-CM | POA: Diagnosis not present

## 2021-04-30 DIAGNOSIS — J189 Pneumonia, unspecified organism: Secondary | ICD-10-CM | POA: Diagnosis not present

## 2021-04-30 DIAGNOSIS — J9811 Atelectasis: Secondary | ICD-10-CM | POA: Diagnosis not present

## 2021-04-30 DIAGNOSIS — T17500A Unspecified foreign body in bronchus causing asphyxiation, initial encounter: Secondary | ICD-10-CM | POA: Diagnosis not present

## 2021-04-30 DIAGNOSIS — Z9989 Dependence on other enabling machines and devices: Secondary | ICD-10-CM | POA: Diagnosis not present

## 2021-04-30 DIAGNOSIS — K668 Other specified disorders of peritoneum: Secondary | ICD-10-CM | POA: Diagnosis not present

## 2021-04-30 DIAGNOSIS — R188 Other ascites: Secondary | ICD-10-CM | POA: Diagnosis not present

## 2021-04-30 DIAGNOSIS — A419 Sepsis, unspecified organism: Secondary | ICD-10-CM | POA: Diagnosis not present

## 2021-04-30 DIAGNOSIS — T83518A Infection and inflammatory reaction due to other urinary catheter, initial encounter: Secondary | ICD-10-CM | POA: Diagnosis not present

## 2021-04-30 DIAGNOSIS — J90 Pleural effusion, not elsewhere classified: Secondary | ICD-10-CM | POA: Diagnosis not present

## 2021-04-30 DIAGNOSIS — J9620 Acute and chronic respiratory failure, unspecified whether with hypoxia or hypercapnia: Secondary | ICD-10-CM | POA: Diagnosis not present

## 2021-04-30 DIAGNOSIS — E43 Unspecified severe protein-calorie malnutrition: Secondary | ICD-10-CM | POA: Diagnosis not present

## 2021-04-30 DIAGNOSIS — R846 Abnormal cytological findings in specimens from respiratory organs and thorax: Secondary | ICD-10-CM | POA: Diagnosis not present

## 2021-04-30 DIAGNOSIS — J9690 Respiratory failure, unspecified, unspecified whether with hypoxia or hypercapnia: Secondary | ICD-10-CM | POA: Diagnosis not present

## 2021-04-30 DIAGNOSIS — Z9981 Dependence on supplemental oxygen: Secondary | ICD-10-CM | POA: Diagnosis not present

## 2021-04-30 DIAGNOSIS — R918 Other nonspecific abnormal finding of lung field: Secondary | ICD-10-CM | POA: Diagnosis not present

## 2021-05-01 DIAGNOSIS — Z720 Tobacco use: Secondary | ICD-10-CM | POA: Diagnosis not present

## 2021-05-01 DIAGNOSIS — K668 Other specified disorders of peritoneum: Secondary | ICD-10-CM | POA: Diagnosis not present

## 2021-05-01 DIAGNOSIS — J189 Pneumonia, unspecified organism: Secondary | ICD-10-CM | POA: Diagnosis not present

## 2021-05-01 DIAGNOSIS — R918 Other nonspecific abnormal finding of lung field: Secondary | ICD-10-CM | POA: Diagnosis not present

## 2021-05-01 DIAGNOSIS — J9620 Acute and chronic respiratory failure, unspecified whether with hypoxia or hypercapnia: Secondary | ICD-10-CM | POA: Diagnosis not present

## 2021-05-01 DIAGNOSIS — J90 Pleural effusion, not elsewhere classified: Secondary | ICD-10-CM | POA: Diagnosis not present

## 2021-05-01 DIAGNOSIS — R188 Other ascites: Secondary | ICD-10-CM | POA: Diagnosis not present

## 2021-05-01 DIAGNOSIS — A419 Sepsis, unspecified organism: Secondary | ICD-10-CM | POA: Diagnosis not present

## 2021-05-01 DIAGNOSIS — Z9981 Dependence on supplemental oxygen: Secondary | ICD-10-CM | POA: Diagnosis not present

## 2021-05-01 DIAGNOSIS — Z9989 Dependence on other enabling machines and devices: Secondary | ICD-10-CM | POA: Diagnosis not present

## 2021-05-01 DIAGNOSIS — T83518A Infection and inflammatory reaction due to other urinary catheter, initial encounter: Secondary | ICD-10-CM | POA: Diagnosis not present

## 2021-05-01 DIAGNOSIS — J9690 Respiratory failure, unspecified, unspecified whether with hypoxia or hypercapnia: Secondary | ICD-10-CM | POA: Diagnosis not present

## 2021-05-01 DIAGNOSIS — T17500A Unspecified foreign body in bronchus causing asphyxiation, initial encounter: Secondary | ICD-10-CM | POA: Diagnosis not present

## 2021-05-01 DIAGNOSIS — E43 Unspecified severe protein-calorie malnutrition: Secondary | ICD-10-CM | POA: Diagnosis not present

## 2021-05-02 DIAGNOSIS — R918 Other nonspecific abnormal finding of lung field: Secondary | ICD-10-CM | POA: Diagnosis not present

## 2021-05-02 DIAGNOSIS — Z720 Tobacco use: Secondary | ICD-10-CM | POA: Diagnosis not present

## 2021-05-02 DIAGNOSIS — A419 Sepsis, unspecified organism: Secondary | ICD-10-CM | POA: Diagnosis not present

## 2021-05-02 DIAGNOSIS — J9690 Respiratory failure, unspecified, unspecified whether with hypoxia or hypercapnia: Secondary | ICD-10-CM | POA: Diagnosis not present

## 2021-05-02 DIAGNOSIS — Z9911 Dependence on respirator [ventilator] status: Secondary | ICD-10-CM | POA: Diagnosis not present

## 2021-05-02 DIAGNOSIS — J9620 Acute and chronic respiratory failure, unspecified whether with hypoxia or hypercapnia: Secondary | ICD-10-CM | POA: Diagnosis not present

## 2021-05-02 DIAGNOSIS — J189 Pneumonia, unspecified organism: Secondary | ICD-10-CM | POA: Diagnosis not present

## 2021-05-02 DIAGNOSIS — T17500A Unspecified foreign body in bronchus causing asphyxiation, initial encounter: Secondary | ICD-10-CM | POA: Diagnosis not present

## 2021-05-02 DIAGNOSIS — E43 Unspecified severe protein-calorie malnutrition: Secondary | ICD-10-CM | POA: Diagnosis not present

## 2021-05-02 DIAGNOSIS — K668 Other specified disorders of peritoneum: Secondary | ICD-10-CM | POA: Diagnosis not present

## 2021-05-02 DIAGNOSIS — T83518A Infection and inflammatory reaction due to other urinary catheter, initial encounter: Secondary | ICD-10-CM | POA: Diagnosis not present

## 2021-05-03 DIAGNOSIS — K668 Other specified disorders of peritoneum: Secondary | ICD-10-CM | POA: Diagnosis not present

## 2021-05-03 DIAGNOSIS — A419 Sepsis, unspecified organism: Secondary | ICD-10-CM | POA: Diagnosis not present

## 2021-05-03 DIAGNOSIS — T17500A Unspecified foreign body in bronchus causing asphyxiation, initial encounter: Secondary | ICD-10-CM | POA: Diagnosis not present

## 2021-05-03 DIAGNOSIS — E43 Unspecified severe protein-calorie malnutrition: Secondary | ICD-10-CM | POA: Diagnosis not present

## 2021-05-03 DIAGNOSIS — J189 Pneumonia, unspecified organism: Secondary | ICD-10-CM | POA: Diagnosis not present

## 2021-05-03 DIAGNOSIS — R918 Other nonspecific abnormal finding of lung field: Secondary | ICD-10-CM | POA: Diagnosis not present

## 2021-05-03 DIAGNOSIS — J9620 Acute and chronic respiratory failure, unspecified whether with hypoxia or hypercapnia: Secondary | ICD-10-CM | POA: Diagnosis not present

## 2021-05-03 DIAGNOSIS — T83518A Infection and inflammatory reaction due to other urinary catheter, initial encounter: Secondary | ICD-10-CM | POA: Diagnosis not present

## 2021-05-03 DIAGNOSIS — Z9911 Dependence on respirator [ventilator] status: Secondary | ICD-10-CM | POA: Diagnosis not present

## 2021-05-03 DIAGNOSIS — Z720 Tobacco use: Secondary | ICD-10-CM | POA: Diagnosis not present

## 2021-05-04 DIAGNOSIS — J90 Pleural effusion, not elsewhere classified: Secondary | ICD-10-CM | POA: Diagnosis not present

## 2021-05-04 DIAGNOSIS — Z720 Tobacco use: Secondary | ICD-10-CM | POA: Diagnosis not present

## 2021-05-04 DIAGNOSIS — K668 Other specified disorders of peritoneum: Secondary | ICD-10-CM | POA: Diagnosis not present

## 2021-05-04 DIAGNOSIS — Z9911 Dependence on respirator [ventilator] status: Secondary | ICD-10-CM | POA: Diagnosis not present

## 2021-05-04 DIAGNOSIS — I517 Cardiomegaly: Secondary | ICD-10-CM | POA: Diagnosis not present

## 2021-05-04 DIAGNOSIS — J9620 Acute and chronic respiratory failure, unspecified whether with hypoxia or hypercapnia: Secondary | ICD-10-CM | POA: Diagnosis not present

## 2021-05-04 DIAGNOSIS — J189 Pneumonia, unspecified organism: Secondary | ICD-10-CM | POA: Diagnosis not present

## 2021-05-04 DIAGNOSIS — R918 Other nonspecific abnormal finding of lung field: Secondary | ICD-10-CM | POA: Diagnosis not present

## 2021-05-04 DIAGNOSIS — T17500A Unspecified foreign body in bronchus causing asphyxiation, initial encounter: Secondary | ICD-10-CM | POA: Diagnosis not present

## 2021-05-04 DIAGNOSIS — J9690 Respiratory failure, unspecified, unspecified whether with hypoxia or hypercapnia: Secondary | ICD-10-CM | POA: Diagnosis not present
